# Patient Record
Sex: Male | Born: 1975 | Race: White | Hispanic: No | Marital: Married | Smoking: Current every day smoker
Health system: Southern US, Community
[De-identification: ages and names within clinical notes are randomized; demographics above are authoritative.]

## PROBLEM LIST (undated history)

## (undated) DIAGNOSIS — R0609 Other forms of dyspnea: Secondary | ICD-10-CM

## (undated) DIAGNOSIS — J181 Lobar pneumonia, unspecified organism: Principal | ICD-10-CM

## (undated) DIAGNOSIS — G2581 Restless legs syndrome: Secondary | ICD-10-CM

## (undated) DIAGNOSIS — E785 Hyperlipidemia, unspecified: Secondary | ICD-10-CM

## (undated) DIAGNOSIS — J939 Pneumothorax, unspecified: Secondary | ICD-10-CM

## (undated) DIAGNOSIS — B029 Zoster without complications: Secondary | ICD-10-CM

## (undated) DIAGNOSIS — F17200 Nicotine dependence, unspecified, uncomplicated: Secondary | ICD-10-CM

## (undated) DIAGNOSIS — L409 Psoriasis, unspecified: Secondary | ICD-10-CM

## (undated) DIAGNOSIS — R05 Cough: Principal | ICD-10-CM

## (undated) HISTORY — DX: Cough: R05

## (undated) HISTORY — PX: VASECTOMY: SHX75

## (undated) HISTORY — DX: Hyperlipidemia, unspecified: E78.5

## (undated) HISTORY — DX: Other forms of dyspnea: R06.09

## (undated) HISTORY — PX: CHEST TUBE INSERTION: SHX231

## (undated) HISTORY — DX: Nicotine dependence, unspecified, uncomplicated: F17.200

## (undated) HISTORY — PX: MANDIBLE SURGERY: SHX707

## (undated) HISTORY — DX: Pneumothorax, unspecified: J93.9

## (undated) HISTORY — DX: Zoster without complications: B02.9

## (undated) HISTORY — DX: Restless legs syndrome: G25.81

## (undated) HISTORY — DX: Lobar pneumonia, unspecified organism: J18.1

---

## 1996-04-10 DIAGNOSIS — J939 Pneumothorax, unspecified: Secondary | ICD-10-CM

## 1996-04-10 HISTORY — DX: Pneumothorax, unspecified: J93.9

## 2016-05-09 ENCOUNTER — Emergency Department (INDEPENDENT_AMBULATORY_CARE_PROVIDER_SITE_OTHER)
Admission: EM | Admit: 2016-05-09 | Discharge: 2016-05-09 | Disposition: A | Discharge status: Home / Self Care | Payer: Managed Care, Other (non HMO) | Source: Home / Self Care | Attending: Emergency Medicine | Admitting: Emergency Medicine

## 2016-05-09 ENCOUNTER — Encounter: Payer: Self-pay | Admitting: *Deleted

## 2016-05-09 DIAGNOSIS — M79644 Pain in right finger(s): Secondary | ICD-10-CM | POA: Diagnosis not present

## 2016-05-09 DIAGNOSIS — L03019 Cellulitis of unspecified finger: Secondary | ICD-10-CM

## 2016-05-09 DIAGNOSIS — L03011 Cellulitis of right finger: Secondary | ICD-10-CM | POA: Diagnosis not present

## 2016-05-09 DIAGNOSIS — M79609 Pain in unspecified limb: Secondary | ICD-10-CM

## 2016-05-09 MED ORDER — HYDROCODONE-ACETAMINOPHEN 5-325 MG PO TABS: 1.0000 | ORAL_TABLET | ORAL | 0 refills | Status: DC | PRN

## 2016-05-09 MED ORDER — AMOXICILLIN-POT CLAVULANATE 875-125 MG PO TABS: 1.0000 | ORAL_TABLET | Freq: Two times a day (BID) | ORAL | 0 refills | Status: DC

## 2016-05-09 NOTE — Discharge Instructions (Signed)
Take antibiotic as instructed. Vicodin if needed for severe pain, otherwise may take OTC ibuprofen, 3 by mouth every 8 hours with food for moderate pain. Were sending off a culture to try to identify the germ, this may take up to 3 days.

## 2016-05-09 NOTE — ED Triage Notes (Signed)
Pt c/o RT 4th finger pain and swelling x 2 wks, worse x 1 day. Denies fever. No OTC meds.

## 2016-05-09 NOTE — ED Provider Notes (Signed)
[facility]    CSN: [id number] Arrival date & time: 05/09/16  0845     History   Chief Complaint Chief Complaint  Patient presents with  . Hand Pain    HPI Darryl Vaughn is a 41 y.o. male.   HPI Pt c/o RT 4th finger pain and swelling x 2 wks, worse x 1 day.The throbbing pain is moderate at rest, severe to touch it. No radiation. No bleeding or drainage. He recalls no definite injury, but he admits that he chews fingernails sometimes, and he feels he has finger infection.  Denies fever.  No OTC meds.  History reviewed. No pertinent past medical history. Denies history of chronic disease. Denies diabetes or hypertension Denies any regular prescription medication There are no active problems to display for this patient.   History reviewed. No pertinent surgical history.   denies any history of major surgery   Home Medications    Prior to Admission medications   Medication Sig Start Date End Date Taking? Authorizing Provider  amoxicillin-clavulanate (AUGMENTIN) 875-125 MG tablet Take 1 tablet by mouth every 12 (twelve) hours. Take for 10 days. Take with food. 05/09/16   Lajean Manes, MD  HYDROcodone-acetaminophen (NORCO/VICODIN) 5-325 MG tablet Take 1-2 tablets by mouth every 4 (four) hours as needed for severe pain. Take with food. 05/09/16   Lajean Manes, MD    Family History Family History  Problem Relation Age of Onset  . Heart disease Mother   . Drug abuse Mother   . Heart disease Father     Social History Social History  Substance Use Topics  . Smoking status: Current Every Day Smoker    Packs/day: 1.00    Types: Cigarettes  . Smokeless tobacco: Never Used  . Alcohol use No     Allergies   Patient has no known allergies.   Review of Systems Review of Systems  Constitutional: Negative for fever.  Gastrointestinal: Negative for nausea and vomiting.  All other systems reviewed and are negative.    Physical Exam Triage Vital  Signs ED Triage Vitals  Enc Vitals Group     BP 05/09/16 0905 120/88     Pulse Rate 05/09/16 0905 95     Resp 05/09/16 0905 16     Temp 05/09/16 0905 98 F (36.7 C)     Temp Source 05/09/16 0905 Oral     SpO2 05/09/16 0905 99 %     Weight 05/09/16 0905 142 lb (64.4 kg)     Height 05/09/16 0905 5\' 9"  (1.753 m)     Head Circumference --      Peak Flow --      Pain Score 05/09/16 0907 7     Pain Loc --      Pain Edu? --      Excl. in GC? --    No data found.   Updated Vital Signs BP 120/88 (BP Location: Left Arm)   Pulse 95   Temp 98 F (36.7 C) (Oral)   Resp 16   Ht 5\' 9"  (1.753 m)   Wt 142 lb (64.4 kg)   SpO2 99%   BMI 20.97 kg/m   Visual Acuity Right Eye Distance:   Left Eye Distance:   Bilateral Distance:    Right Eye Near:   Left Eye Near:    Bilateral Near:     Physical Exam  Constitutional: He is oriented to person, place, and time. He appears well-developed and well-nourished. No distress.  HENT:  Head:  Normocephalic and atraumatic.  Eyes: Pupils are equal, round, and reactive to light. No scleral icterus.  Neck: Normal range of motion. Neck supple.  Cardiovascular: Normal rate and regular rhythm.   Pulmonary/Chest: Effort normal.  Abdominal: He exhibits no distension.  Musculoskeletal:       Hands: Neurological: He is alert and oriented to person, place, and time. No cranial nerve deficit.  Skin: Skin is warm and dry.  Psychiatric: He has a normal mood and affect. His behavior is normal.  Vitals reviewed.    UC Treatments / Results  Labs (all labs ordered are listed, but only abnormal results are displayed) Labs Reviewed  WOUND CULTURE   Narrative:    Performed at:  [facility]                [address]                [place], [place] [address]    EKG  EKG Interpretation None       Radiology No results found.  Procedures- Note: Procedure performed 05/09/16, 9:20 AM ..Incision and Drainage Date/Time:  05/11/2016 1:40 PM Performed by: Georgina Pillion, DAVID Authorized by: Georgina Pillion, DAVID   Consent:    Consent obtained:  Verbal   Consent given by:  Patient   Risks discussed:  Bleeding, incomplete drainage, infection and pain   Alternatives discussed:  No treatment, delayed treatment, observation and referral Location:    Type:  Abscess (Paronychia right fourth finger)   Size:  1 cm Anesthesia (see MAR for exact dosages):    Anesthesia method:  Nerve block   Block location:  Digital block   Block needle gauge:  27 G   Block anesthetic:  Lidocaine 2% w/o epi   Block outcome:  Anesthesia achieved Procedure type:    Complexity:  Simple Procedure details:    Incision types:  Stab incision   Incision depth:  Subcutaneous   Scalpel blade:  11   Wound management:  Probed and deloculated   Drainage:  Purulent   Drainage amount:  Copious   Wound treatment:  Wound left open Post-procedure details:    Patient tolerance of procedure:  Tolerated well, no immediate complications Comments:     Wound culture sent. Wound care discussed verbally and also written instructions given. An After Visit Summary was printed and given to the patient.     (including critical care time)  Medications Ordered in UC Medications - No data to display   Initial Impression / Assessment and Plan / UC Course  I have reviewed the triage vital signs and the nursing notes.  Pertinent labs & imaging results that were available during my care of the patient were reviewed by me and considered in my medical decision making (see chart for details).     Final Clinical Impressions(s) / UC Diagnoses   Final diagnoses:  Paronychia of right ring finger  Pain in finger of right hand  Successful incision and drainage performed after 2% Xylocaine digital block. An After Visit Summary was printed and given to the patient. Wound culture sent off.  For pain, small amount of Vicodin prescribed. Otherwise ibuprofen. Pending  wound culture, I prescribed Augmentin for antibacterial coverage. We'll cover most staff, gram positives, and anaerobes, and oral flora (with history of chewing fingernails) (I advised him to avoid chewing fingernails)  New Prescriptions Discharge Medication List as of 05/09/2016  9:50 AM    START taking these medications   Details  amoxicillin-clavulanate (  AUGMENTIN) 875-125 MG tablet Take 1 tablet by mouth every 12 (twelve) hours. Take for 10 days. Take with food., Starting Tue 05/09/2016, Print    HYDROcodone-acetaminophen (NORCO/VICODIN) 5-325 MG tablet Take 1-2 tablets by mouth every 4 (four) hours as needed for severe pain. Take with food., Starting Tue 05/09/2016, Print      Follow-up here in 2-3 days if not improving, or sooner if symptoms become worse. Precautions discussed. Red flags discussed. Questions invited and answered. Patient voiced understanding and agreement.    Lajean Manesavid Massey, MD 05/11/16 1351

## 2016-05-12 ENCOUNTER — Telehealth: Payer: Self-pay | Admitting: Emergency Medicine

## 2016-05-12 LAB — WOUND CULTURE

## 2016-05-22 ENCOUNTER — Telehealth: Payer: Self-pay | Admitting: Emergency Medicine

## 2016-05-22 NOTE — Telephone Encounter (Signed)
Hope his finger is healing or healed by now, we are here for you if you need us. Take care

## 2016-08-19 ENCOUNTER — Encounter: Payer: Self-pay | Admitting: Emergency Medicine

## 2016-08-19 ENCOUNTER — Emergency Department (INDEPENDENT_AMBULATORY_CARE_PROVIDER_SITE_OTHER)
Admission: EM | Admit: 2016-08-19 | Discharge: 2016-08-19 | Disposition: A | Discharge status: Home / Self Care | Payer: Managed Care, Other (non HMO) | Source: Home / Self Care | Attending: Family Medicine | Admitting: Family Medicine

## 2016-08-19 DIAGNOSIS — B029 Zoster without complications: Secondary | ICD-10-CM | POA: Diagnosis not present

## 2016-08-19 DIAGNOSIS — L409 Psoriasis, unspecified: Secondary | ICD-10-CM

## 2016-08-19 HISTORY — DX: Psoriasis, unspecified: L40.9

## 2016-08-19 MED ORDER — MUPIROCIN CALCIUM 2 % EX CREA: 1.0000 "application " | TOPICAL_CREAM | Freq: Three times a day (TID) | CUTANEOUS | 0 refills | Status: DC

## 2016-08-19 MED ORDER — VALACYCLOVIR HCL 1 G PO TABS: 1000.0000 mg | ORAL_TABLET | Freq: Three times a day (TID) | ORAL | 0 refills | Status: DC

## 2016-08-19 NOTE — ED Provider Notes (Signed)
[facility]    CSN: [id number] Arrival date & time: 08/19/16  0907     History   Chief Complaint Chief Complaint  Patient presents with  . Mass    HPI Darryl Vaughn is a 41 y.o. male.   Patient was working outside one week ago, and the next day he felt some discomfort on his right chest.  He was concerned that he may have had an insect bite, although he does not recall any stinging sensation.  Five days ago he noticed a distinct rash on his right chest with surrounding pain and sensation of burning.  The lesions have not improved by applications of Neosporing, but have "scabbed over" somewhat.  He feels well otherwise.  No fevers, chills, and sweats. He has chronic psoriasis that he manages with OTC topical creams.    The history is provided by the patient.  Rash  Location:  Torso Torso rash location:  R chest Quality: burning, dryness, painful, redness and swelling   Quality: not blistering, not draining and not weeping   Pain details:    Quality:  Aching and burning   Severity:  Mild   Onset quality:  Gradual   Duration:  1 week   Timing:  Constant   Progression:  Worsening Severity:  Mild Onset quality:  Gradual Timing:  Constant Progression:  Worsening Chronicity:  New Context: not animal contact, not chemical exposure, not exposure to similar rash, not food, not hot tub use, not insect bite/sting, not medications, not new detergent/soap and not plant contact   Relieved by:  Nothing Worsened by:  Nothing Ineffective treatments:  Antibiotic cream Associated symptoms: no abdominal pain, no diarrhea, no fatigue, no fever, no headaches, no induration, no joint pain, no myalgias, no nausea and no sore throat     Past Medical History:  Diagnosis Date  . Psoriasis     There are no active problems to display for this patient.   Past Surgical History:  Procedure Laterality Date  . LUNG SURGERY     collapsed lung  . MANDIBLE SURGERY          Home Medications    Prior to Admission medications   Medication Sig Start Date End Date Taking? Authorizing Provider  Johnson [employer] JohnsonCoal Tar Extract (PSORIASIN EX) Apply topically.   Yes [provider]  mupirocin cream (BACTROBAN) 2 % Apply 1 application topically 3 (three) times daily. (Rx void after 08/27/16) 08/19/16   Lattie HawBeese, Asah Lamay A, MD  valACYclovir (VALTREX) 1000 MG tablet Take 1 tablet (1,000 mg total) by mouth 3 (three) times daily. 08/19/16   Lattie HawBeese, Ryden Wainer A, MD    Family History Family History  Problem Relation Age of Onset  . Heart disease Mother   . Drug abuse Mother   . Heart disease Father     Social History Social History  Substance Use Topics  . Smoking status: Current Every Day Smoker    Packs/day: 1.00    Types: Cigarettes  . Smokeless tobacco: Never Used  . Alcohol use No     Allergies   Patient has no known allergies.   Review of Systems Review of Systems  Constitutional: Negative for fatigue and fever.  HENT: Negative for sore throat.   Gastrointestinal: Negative for abdominal pain, diarrhea and nausea.  Musculoskeletal: Negative for arthralgias and myalgias.  Skin: Positive for rash.  Neurological: Negative for headaches.  All other systems reviewed and are negative.    Physical Exam Triage Vital Signs ED Triage Vitals  Enc Vitals Group     BP 08/19/16 0934 111/76     Pulse Rate 08/19/16 0934 87     Resp 08/19/16 0934 16     Temp 08/19/16 0934 98.2 F (36.8 C)     Temp Source 08/19/16 0934 Oral     SpO2 08/19/16 0934 96 %     Weight 08/19/16 0936 139 lb (63 kg)     Height 08/19/16 0936 5\' 9"  (1.753 m)     Head Circumference --      Peak Flow --      Pain Score 08/19/16 0936 5     Pain Loc --      Pain Edu? --      Excl. in GC? --    No data found.   Updated Vital Signs BP 111/76 (BP Location: Left Arm)   Pulse 87   Temp 98.2 F (36.8 C) (Oral)   Resp 16   Ht 5\' 9"  (1.753 m)   Wt 139 lb (63 kg)   SpO2 96%   BMI  20.53 kg/m   Visual Acuity Right Eye Distance:   Left Eye Distance:   Bilateral Distance:    Right Eye Near:   Left Eye Near:    Bilateral Near:     Physical Exam  Constitutional: He appears well-developed and well-nourished. No distress.  HENT:  Head: Normocephalic.  Nose: Nose normal.  Mouth/Throat: Oropharynx is clear and moist.  Eyes: Conjunctivae are normal. Pupils are equal, round, and reactive to light.  Cardiovascular: Normal heart sounds.   Pulmonary/Chest: Breath sounds normal.    Beneath the right nipple is a crop of typical herpetiform lesions as noted on diagram.  No evidence cellulitis.  In addition to the herpetic eruption on right chest, there are generalized typical psoriatic lesions on torso and extremities.  Lymphadenopathy:    He has no cervical adenopathy.  Neurological: He is alert.  Skin: Skin is warm and dry.  Nursing note and vitals reviewed.    UC Treatments / Results  Labs (all labs ordered are listed, but only abnormal results are displayed) Labs Reviewed - No data to display  EKG  EKG Interpretation None       Radiology No results found.  Procedures Procedures (including critical care time)  Medications Ordered in UC Medications - No data to display   Initial Impression / Assessment and Plan / UC Course  I have reviewed the triage vital signs and the nursing notes.  Pertinent labs & imaging results that were available during my care of the patient were reviewed by me and considered in my medical decision making (see chart for details).    Begin Valtrex.  Stop applying Neosporin (does not appear to have cellulitis) However, may begin applying mupirocin cream (Bactroban) if rash begins to look infected (given Rx to hold). Followup with Family Doctor if not improved in 7 to 10 days.      Final Clinical Impressions(s) / UC Diagnoses   Final diagnoses:  Herpes zoster without complication    New Prescriptions New  Prescriptions   MUPIROCIN CREAM (BACTROBAN) 2 %    Apply 1 application topically 3 (three) times daily. (Rx void after 08/27/16)   VALACYCLOVIR (VALTREX) 1000 MG TABLET    Take 1 tablet (1,000 mg total) by mouth 3 (three) times daily.     Lattie Haw, MD 08/19/16 1010

## 2016-08-19 NOTE — Discharge Instructions (Signed)
Begin applying mupirocin cream (Bactroban) if rash begins to look infected.

## 2016-08-19 NOTE — ED Triage Notes (Signed)
Patient has chronic psoriasis and is currently experiencing rash scattered all over body, with heavy area on trunk. For past 5 days has noticed one area on right side of chest below breast area that is edematous and aching under rash.

## 2016-08-21 ENCOUNTER — Telehealth: Payer: Self-pay

## 2016-08-21 NOTE — Telephone Encounter (Signed)
Pt feeling better, feels that the medication is working.  Will follow up with PCP or UC as needed.

## 2016-08-22 ENCOUNTER — Ambulatory Visit (INDEPENDENT_AMBULATORY_CARE_PROVIDER_SITE_OTHER): Payer: Managed Care, Other (non HMO) | Admitting: Physician Assistant

## 2016-08-22 ENCOUNTER — Encounter: Payer: Self-pay | Admitting: Physician Assistant

## 2016-08-22 VITALS — BP 138/81 | HR 89 | Resp 14 | Ht 68.0 in | Wt 139.0 lb

## 2016-08-22 DIAGNOSIS — E785 Hyperlipidemia, unspecified: Secondary | ICD-10-CM

## 2016-08-22 DIAGNOSIS — F17200 Nicotine dependence, unspecified, uncomplicated: Secondary | ICD-10-CM | POA: Diagnosis not present

## 2016-08-22 DIAGNOSIS — Z7689 Persons encountering health services in other specified circumstances: Secondary | ICD-10-CM | POA: Diagnosis not present

## 2016-08-22 DIAGNOSIS — Z8249 Family history of ischemic heart disease and other diseases of the circulatory system: Secondary | ICD-10-CM | POA: Diagnosis not present

## 2016-08-22 DIAGNOSIS — L409 Psoriasis, unspecified: Secondary | ICD-10-CM | POA: Diagnosis not present

## 2016-08-22 DIAGNOSIS — R0602 Shortness of breath: Secondary | ICD-10-CM | POA: Diagnosis not present

## 2016-08-22 DIAGNOSIS — R030 Elevated blood-pressure reading, without diagnosis of hypertension: Secondary | ICD-10-CM

## 2016-08-22 HISTORY — DX: Nicotine dependence, unspecified, uncomplicated: F17.200

## 2016-08-22 LAB — CBC
HEMATOCRIT: 41.9 % (ref 38.5–50.0)
HEMOGLOBIN: 14.3 g/dL (ref 13.2–17.1)
MCH: 30.5 pg (ref 27.0–33.0)
MCHC: 34.1 g/dL (ref 32.0–36.0)
MCV: 89.3 fL (ref 80.0–100.0)
MPV: 8.5 fL (ref 7.5–12.5)
PLATELETS: 255 10*3/uL (ref 140–400)
RBC: 4.69 MIL/uL (ref 4.20–5.80)
RDW: 13.9 % (ref 11.0–15.0)
WBC: 10.4 10*3/uL (ref 3.8–10.8)

## 2016-08-22 NOTE — Progress Notes (Signed)
HPI:                                                                Darryl Vaughn is a 41 y.o. male who presents to [facility]: Primary Care Sports Medicine today to establish care   Current Concerns include psoriasis, shortness of breath  Patient reports history of chronic psoriasis involving his scalp, abdomen and extensor surfaces of his elbows and knees. He has tried multiple prescription topicals in the past and states they did not control his symptoms and they stained his clothes.   Shortness of breath: patient reports SOB at rest and with exertion over the last 2-3 weeks. He also endorses an intermittent cough. Patient is a current everyday smoker with 22 pack years. He is also currently being treated for Shingles on his right torso, which has made it difficult to take a deep breath. He denies fever, chills, malaise, hemoptysis. He denies history of COPD or asthma.  Health Maintenance Health Maintenance  Topic Date Due  . HIV Screening  03/08/1990  . INFLUENZA VACCINE  11/08/2016  . TETANUS/TDAP  04/10/2022    Past Medical History:  Diagnosis Date  . Pneumothorax 1998  . Psoriasis   . Shingles    Past Surgical History:  Procedure Laterality Date  . CHEST TUBE INSERTION    . MANDIBLE SURGERY    . VASECTOMY    . VASECTOMY     Social History  Substance Use Topics  . Smoking status: Current Every Day Smoker    Packs/day: 1.00    Years: 22.00    Types: Cigarettes  . Smokeless tobacco: Never Used  . Alcohol use No   family history includes Drug abuse in his mother; Heart attack in his father; Heart disease in his father and mother; Hypertension in his father.  ROS: negative except as noted in the HPI  Medications: Current Outpatient Prescriptions  Medication Sig Dispense Refill  . valACYclovir (VALTREX) 1000 MG tablet Take 1 tablet (1,000 mg total) by mouth 3 (three) times daily. 21 tablet 0   No current facility-administered medications for  this visit.    No Known Allergies     Objective:  BP 138/81   Pulse 89   Ht 5\' 8"  (1.727 m)   Wt 139 lb (63 kg)   BMI 21.13 kg/m  Gen: well-groomed, cooperative, not ill-appearing, no distress Pulm: Normal work of breathing, normal phonation, clear to auscultation bilaterally CV: Normal rate, regular rhythm, s1 and s2 distinct, no murmurs, clicks or rubs, no carotid bruit Neuro: alert and oriented x 3, EOM's intact MSK: moving all extremities, normal gait and station, no peripheral edema Psych: good eye contact, normal affect, euthymic mood, normal speech and thought content Skin: multiple, scaly plaques on extensor surfaces of bilateral elbows and knees; crusting, herpetiform lesions on right chest wall just inferior to the right nipple  No results found.  Depression screen PHQ 2/9 08/22/2016  Decreased Interest 0  Down, Depressed, Hopeless 0  PHQ - 2 Score 0      Assessment and Plan: 41 y.o. male with   1. Encounter to establish care - CBC - Comprehensive metabolic panel - Lipid Panel w/reflex Direct LDL - negative PHQ2  2. Tobacco use disorder, Shortness of breath -  CXR 2 View - recommend follow-up Spirometry in 4 weeks / when Shingles rash has resolved - recommended smoking cessation / cutting back on cigarettes  3. Family history of heart attack - patient has cardiac risk factors including tobacco use - checking lipid panel/LDL - counseled on heart healthy lifestyle  4. Psoriasis - patient is a good candidate for biologic therapy - Ambulatory referral to Dermatology  5. Elevated blood pressure reading BP Readings from Last 3 Encounters:  08/22/16 138/81  08/19/16 111/76  05/09/16 120/88  - patient instructed on how to monitor BP's at home and provided with log - limit salt. DASH eating plan - increase CV activity - follow-up in 4 weeks   Patient education and anticipatory guidance given Patient agrees with treatment plan Follow-up 4 weeks for  Spirometry/Blood pressure or sooner as needed  Levonne Hubertharley E. Elli Groesbeck PA-C

## 2016-08-22 NOTE — Patient Instructions (Addendum)
- I have placed a referral for you to see dermatology to discuss starting systemic/oral medication for your psoriasis. There is a medication called Enbrel that you may be a good candidate for. This is a biologic medication that requires some testing before it can be safely started  - Return in 4 weeks for spirometry (lung function testing) to evaluate your shortness of breath. Consider cutting back on cigarettes / smoking cessation  - Go downstairs for labs  For your blood pressure: - Check blood pressure at home for the next 2 weeks - Check around the same time each day in a relaxed setting (rest for at least 3 minutes, legs uncrossed, arm resting on a table at level or your heart) - Limit salt. Follow DASH eating plan - Follow-up in 2-3 weeks   Physical Activity Recommendations for modifying lipids and lowering blood pressure Engage in aerobic physical activity to reduce LDL-cholesterol, non-HDL-cholesterol, and blood pressure  Frequency: 3-4 sessions per week  Intensity: moderate to vigorous  Duration: 40 minutes on average  Physical Activity Recommendations for secondary prevention 1. Aerobic exercise  Frequency: 3-5 sessions per week  Intensity: 50-80% capacity  Duration: 20 - 60 minutes  Examples: walking, treadmill, cycling, rowing, stair climbing, and arm/leg ergometry  2. Resistance exercise  Frequency: 2-3 sessions per week  Intensity: 10-15 repetitions/set to moderate fatigue  Duration: 1-3 sets of 8-10 upper and lower body exercises  Examples: calisthenics, elastic bands, cuff/hand weights, dumbbels, free weights, wall pulleys, and weight machines  Heart-Healthy Lifestyle  Eating a diet rich in vegetables, fruits and whole grains: also includes low-fat dairy products, poultry, fish, legumes, and nuts; limit intake of sweets, sugar-sweetened beverages and red meats  Getting regular exercise  Maintaining a healthy weight  Not smoking or getting help  quitting  Staying on top of your health; for some people, lifestyle changes alone may not be enough to prevent a heart attack or stroke. In these cases, taking a statin at the right dose will most likely be necessary   DASH Eating Plan DASH stands for "Dietary Approaches to Stop Hypertension." The DASH eating plan is a healthy eating plan that has been shown to reduce high blood pressure (hypertension). It may also reduce your risk for type 2 diabetes, heart disease, and stroke. The DASH eating plan may also help with weight loss. What are tips for following this plan? General guidelines   Avoid eating more than 2,300 mg (milligrams) of salt (sodium) a day. If you have hypertension, you may need to reduce your sodium intake to 1,500 mg a day.  Limit alcohol intake to no more than 1 drink a day for nonpregnant women and 2 drinks a day for men. One drink equals 12 oz of beer, 5 oz of wine, or 1 oz of hard liquor.  Work with your health care provider to maintain a healthy body weight or to lose weight. Ask what an ideal weight is for you.  Get at least 30 minutes of exercise that causes your heart to beat faster (aerobic exercise) most days of the week. Activities may include walking, swimming, or biking.  Work with your health care provider or diet and nutrition specialist (dietitian) to adjust your eating plan to your individual calorie needs. Reading food labels   Check food labels for the amount of sodium per serving. Choose foods with less than 5 percent of the Daily Value of sodium. Generally, foods with less than 300 mg of sodium per serving fit into  this eating plan.  To find whole grains, look for the word "whole" as the first word in the ingredient list. Shopping   Buy products labeled as "low-sodium" or "no salt added."  Buy fresh foods. Avoid canned foods and premade or frozen meals. Cooking   Avoid adding salt when cooking. Use salt-free seasonings or herbs instead of table  salt or sea salt. Check with your health care provider or pharmacist before using salt substitutes.  Do not fry foods. Cook foods using healthy methods such as baking, boiling, grilling, and broiling instead.  Cook with heart-healthy oils, such as olive, canola, soybean, or sunflower oil. Meal planning    Eat a balanced diet that includes:  5 or more servings of fruits and vegetables each day. At each meal, try to fill half of your plate with fruits and vegetables.  Up to 6-8 servings of whole grains each day.  Less than 6 oz of lean meat, poultry, or fish each day. A 3-oz serving of meat is about the same size as a deck of cards. One egg equals 1 oz.  2 servings of low-fat dairy each day.  A serving of nuts, seeds, or beans 5 times each week.  Heart-healthy fats. Healthy fats called Omega-3 fatty acids are found in foods such as flaxseeds and coldwater fish, like sardines, salmon, and mackerel.  Limit how much you eat of the following:  Canned or prepackaged foods.  Food that is high in trans fat, such as fried foods.  Food that is high in saturated fat, such as fatty meat.  Sweets, desserts, sugary drinks, and other foods with added sugar.  Full-fat dairy products.  Do not salt foods before eating.  Try to eat at least 2 vegetarian meals each week.  Eat more home-cooked food and less restaurant, buffet, and fast food.  When eating at a restaurant, ask that your food be prepared with less salt or no salt, if possible. What foods are recommended? The items listed may not be a complete list. Talk with your dietitian about what dietary choices are best for you. Grains  Whole-grain or whole-wheat bread. Whole-grain or whole-wheat pasta. Brown rice. Orpah Cobbatmeal. Quinoa. Bulgur. Whole-grain and low-sodium cereals. Pita bread. Low-fat, low-sodium crackers. Whole-wheat flour tortillas. Vegetables  Fresh or frozen vegetables (raw, steamed, roasted, or grilled). Low-sodium or  reduced-sodium tomato and vegetable juice. Low-sodium or reduced-sodium tomato sauce and tomato paste. Low-sodium or reduced-sodium canned vegetables. Fruits  All fresh, dried, or frozen fruit. Canned fruit in natural juice (without added sugar). Meat and other protein foods  Skinless chicken or [place]urkey. Ground chicken or [place]urkey. Pork with fat trimmed off. Fish and seafood. Egg whites. Dried beans, peas, or lentils. Unsalted nuts, nut butters, and seeds. Unsalted canned beans. Lean cuts of beef with fat trimmed off. Low-sodium, lean deli meat. Dairy  Low-fat (1%) or fat-free (skim) milk. Fat-free, low-fat, or reduced-fat cheeses. Nonfat, low-sodium ricotta or cottage cheese. Low-fat or nonfat yogurt. Low-fat, low-sodium cheese. Fats and oils  Soft margarine without trans fats. Vegetable oil. Low-fat, reduced-fat, or light mayonnaise and salad dressings (reduced-sodium). Canola, safflower, olive, soybean, and sunflower oils. Avocado. Seasoning and other foods  Herbs. Spices. Seasoning mixes without salt. Unsalted popcorn and pretzels. Fat-free sweets. What foods are not recommended? The items listed may not be a complete list. Talk with your dietitian about what dietary choices are best for you. Grains  Baked goods made with fat, such as croissants, muffins, or some breads. Dry pasta or rice  meal packs. Vegetables  Creamed or fried vegetables. Vegetables in a cheese sauce. Regular canned vegetables (not low-sodium or reduced-sodium). Regular canned tomato sauce and paste (not low-sodium or reduced-sodium). Regular tomato and vegetable juice (not low-sodium or reduced-sodium). Rosita Fire. Olives. Fruits  Canned fruit in a light or heavy syrup. Fried fruit. Fruit in cream or butter sauce. Meat and other protein foods  Fatty cuts of meat. Ribs. Fried meat. Tomasa Blase. Sausage. Bologna and other processed lunch meats. Salami. Fatback. Hotdogs. Bratwurst. Salted nuts and seeds. Canned beans with added salt.  Canned or smoked fish. Whole eggs or egg yolks. Chicken or [place] with skin. Dairy  Whole or 2% milk, cream, and half-and-half. Whole or full-fat cream cheese. Whole-fat or sweetened yogurt. Full-fat cheese. Nondairy creamers. Whipped toppings. Processed cheese and cheese spreads. Fats and oils  Butter. Stick margarine. Lard. Shortening. Ghee. Bacon fat. Tropical oils, such as coconut, palm kernel, or palm oil. Seasoning and other foods  Salted popcorn and pretzels. Onion salt, garlic salt, seasoned salt, table salt, and sea salt. Worcestershire sauce. Tartar sauce. Barbecue sauce. Teriyaki sauce. Soy sauce, including reduced-sodium. Steak sauce. Canned and packaged gravies. Fish sauce. Oyster sauce. Cocktail sauce. Horseradish that you find on the shelf. Ketchup. Mustard. Meat flavorings and tenderizers. Bouillon cubes. Hot sauce and Tabasco sauce. Premade or packaged marinades. Premade or packaged taco seasonings. Relishes. Regular salad dressings. Where to find more information:  National Heart, Lung, and Blood Institute: PopSteam.is  American Heart Association: www.heart.org Summary  The DASH eating plan is a healthy eating plan that has been shown to reduce high blood pressure (hypertension). It may also reduce your risk for type 2 diabetes, heart disease, and stroke.  With the DASH eating plan, you should limit salt (sodium) intake to 2,300 mg a day. If you have hypertension, you may need to reduce your sodium intake to 1,500 mg a day.  When on the DASH eating plan, aim to eat more fresh fruits and vegetables, whole grains, lean proteins, low-fat dairy, and heart-healthy fats.  Work with your health care provider or diet and nutrition specialist (dietitian) to adjust your eating plan to your individual calorie needs. This information is not intended to replace advice given to you by your health care provider. Make sure you discuss any questions you have with your health care  provider. Document Released: 03/16/2011 Document Revised: 03/20/2016 Document Reviewed: 03/20/2016 Elsevier Interactive Patient Education  2017 [employer].

## 2016-08-23 LAB — COMPREHENSIVE METABOLIC PANEL
ALBUMIN: 4.4 g/dL (ref 3.6–5.1)
ALK PHOS: 66 U/L (ref 40–115)
ALT: 16 U/L (ref 9–46)
AST: 14 U/L (ref 10–40)
BUN: 11 mg/dL (ref 7–25)
CALCIUM: 9.5 mg/dL (ref 8.6–10.3)
CO2: 23 mmol/L (ref 20–31)
Chloride: 101 mmol/L (ref 98–110)
Creat: 0.82 mg/dL (ref 0.60–1.35)
Glucose, Bld: 118 mg/dL — ABNORMAL HIGH (ref 65–99)
POTASSIUM: 3.9 mmol/L (ref 3.5–5.3)
Sodium: 137 mmol/L (ref 135–146)
TOTAL PROTEIN: 7.4 g/dL (ref 6.1–8.1)
Total Bilirubin: 0.3 mg/dL (ref 0.2–1.2)

## 2016-08-23 LAB — LIPID PANEL W/REFLEX DIRECT LDL
CHOL/HDL RATIO: 7 ratio — AB (ref <5.0)
CHOLESTEROL: 197 mg/dL (ref <200)
HDL: 28 mg/dL — ABNORMAL LOW (ref >40)
LDL-Cholesterol: 136 mg/dL — ABNORMAL HIGH
Non-HDL Cholesterol (Calc): 169 mg/dL — ABNORMAL HIGH (ref <130)
TRIGLYCERIDES: 194 mg/dL — AB (ref <150)

## 2016-08-24 ENCOUNTER — Encounter: Payer: Self-pay | Admitting: Physician Assistant

## 2016-08-24 DIAGNOSIS — R0609 Other forms of dyspnea: Secondary | ICD-10-CM

## 2016-08-24 DIAGNOSIS — R0600 Dyspnea, unspecified: Secondary | ICD-10-CM

## 2016-08-24 DIAGNOSIS — E785 Hyperlipidemia, unspecified: Secondary | ICD-10-CM | POA: Insufficient documentation

## 2016-08-24 HISTORY — DX: Hyperlipidemia, unspecified: E78.5

## 2016-08-24 HISTORY — DX: Dyspnea, unspecified: R06.00

## 2016-08-24 HISTORY — DX: Other forms of dyspnea: R06.09

## 2016-08-25 MED ORDER — ATORVASTATIN CALCIUM 10 MG PO TABS: 10.0000 mg | ORAL_TABLET | Freq: Every day | ORAL | 3 refills | Status: DC

## 2016-08-25 NOTE — Addendum Note (Signed)
Addended by: Gena FrayUMMINGS, CHARLEY E on: 08/25/2016 11:03 AM   Modules accepted: Orders

## 2016-09-08 ENCOUNTER — Ambulatory Visit (INDEPENDENT_AMBULATORY_CARE_PROVIDER_SITE_OTHER): Payer: Managed Care, Other (non HMO)

## 2016-09-08 DIAGNOSIS — R0602 Shortness of breath: Secondary | ICD-10-CM

## 2016-09-19 ENCOUNTER — Ambulatory Visit (INDEPENDENT_AMBULATORY_CARE_PROVIDER_SITE_OTHER): Payer: Managed Care, Other (non HMO) | Admitting: Physician Assistant

## 2016-09-19 ENCOUNTER — Encounter: Payer: Self-pay | Admitting: Physician Assistant

## 2016-09-19 VITALS — BP 127/75 | HR 77 | Ht 69.0 in | Wt 135.0 lb

## 2016-09-19 DIAGNOSIS — F17200 Nicotine dependence, unspecified, uncomplicated: Secondary | ICD-10-CM | POA: Diagnosis not present

## 2016-09-19 DIAGNOSIS — R0602 Shortness of breath: Secondary | ICD-10-CM

## 2016-09-19 DIAGNOSIS — G2581 Restless legs syndrome: Secondary | ICD-10-CM

## 2016-09-19 HISTORY — DX: Restless legs syndrome: G25.81

## 2016-09-19 MED ORDER — ALBUTEROL SULFATE (2.5 MG/3ML) 0.083% IN NEBU
2.5000 mg | INHALATION_SOLUTION | Freq: Once | RESPIRATORY_TRACT | Status: AC
  Administered 2016-09-19: 2.5 mg via RESPIRATORY_TRACT

## 2016-09-19 MED ORDER — ASPIRIN EC 81 MG PO TBEC: 81.0000 mg | DELAYED_RELEASE_TABLET | Freq: Every day | ORAL | 11 refills | Status: DC

## 2016-09-19 NOTE — Progress Notes (Signed)
HPI:                                                                Darryl Vaughn is a 41 y.o. male [facility] today for Spirometry   Patient with PMH of tobacco use, approximately 1 ppd with 22 pack years, reports intermittent SOB at rest and with exertion over the last month. Patient states it can happen after running or with very minimal exertion. He also endorses an intermittent cough, which he describes as mostly "sinus stuff first thing in the morning" that resolves during the day.  He denies fever, chills, unintentional weight loss, malaise, hemoptysis. He denies history of COPD or asthma.  Patient has been monitoring BP's at home. BP range 102-125/66-79. Denies vision change, headache, chest pain with exertion, orthopnea, lightheadedness, syncope and edema. Risk factors include:  Patient is also concerned that he may have restless leg syndrome. He describes an "urge to move" deep in his calf muscles most nights of the week. It is relieved by getting up and walking aorund or tightening his lower leg muscles. He denies numbness, tingling, paresthesias, lower extremity pain or claudication.  No history of IDA. Last Hgb 14.3  Past Medical History:  Diagnosis Date  . Pneumothorax 1998  . Psoriasis   . Shingles    Past Surgical History:  Procedure Laterality Date  . CHEST TUBE INSERTION    . MANDIBLE SURGERY    . VASECTOMY    . VASECTOMY     Social History  Substance Use Topics  . Smoking status: Current Every Day Smoker    Packs/day: 1.00    Years: 22.00    Types: Cigarettes  . Smokeless tobacco: Never Used  . Alcohol use No   family history includes Drug abuse in his mother; Heart attack in his father; Heart disease in his father and mother; Hypertension in his father.  ROS: negative except as noted in the HPI  Medications: Current Outpatient Prescriptions  Medication Sig Dispense Refill  . Apremilast  (OTEZLA PO) Take by mouth.    Marland Kitchen. atorvastatin (LIPITOR) 10 MG tablet Take 1 tablet (10 mg total) by mouth daily. 90 tablet 3   No current facility-administered medications for this visit.    No Known Allergies     Objective:  BP 127/75   Pulse 77   Ht 5\' 9"  (1.753 m)   Wt 135 lb (61.2 kg)   SpO2 99%   BMI 19.94 kg/m  Gen: well-groomed, cooperative, not ill-appearing, no distress Pulm: Normal work of breathing, normal phonation, clear to auscultation bilaterally, no wheezes, rales or rhonchi CV: Normal rate, regular rhythm, s1 and s2 distinct, no murmurs, clicks or rubs  Neuro: alert and oriented x 3, EOM's intact, no tremor MSK: moving all extremities, normal gait and station, no peripheral edema, calves nontender Lymph: no cervical or tonsillar adenopathy Skin: warm, dry, intact; multiple psoriatic plaques on extremities bilaterally   No results found for this or any previous visit (from the past 72 hour(s)). No results found.  Spirometry 09/19/2016 Pre-bronchodilator FVC 4.76 FEV1 3.45, 85% predicted FEV1/FVC 72.5% Post-bronchodilator FVC 4.51 FEV1 3.53 FEV1/FVC 78.3%  Assessment and Plan: 41 y.o. male with   1. SOB (shortness of  breath) - Spirometry: Pre & Post Eval: FEV1 85% predicted, FEV1/FVC >0.7, patient is at risk for COPD given tobacco use - reviewed CXR, which was negative for acute cardiopulmonary disease - Plan to repeat Spirometry/Lung function testing in 1 year or sooner if symptoms worsen (daily cough, more productive cough, shortness of breath with rest)  2. Tobacco use disorder - assessed readiness to quit. Patient is not ready, but did set a date for the fall of 2018 - provided with smoking cessation resources, including [place] Quit Line - instructed patient to return in the fall   3. Restless leg syndrome - patient is going to try lifestyle interventions including heating pads on his legs, avoiding anticholinergic medication, regular exercise, and  massage - patient is going to research medication options, including Requip and Horizant   Patient education and anticipatory guidance given Patient agrees with treatment plan Follow-up in 4 weeks for RLS or sooner as needed if symptoms worsen or fail to improve  Levonne Hubert PA-C

## 2016-09-19 NOTE — Patient Instructions (Addendum)
-   Plan to repeat Spirometry/Lung function testing in 1 year or sooner if symptoms worsen (daily cough, more productive cough, shortness of breath with rest) - Plan to return for smoking cessation counseling when you are ready - Call the [place] Quit Line for free coaching 1-800-QUIT NOW  For Restless Leg, research treatment options provided (Horizant or Requip) and follow-up in 2-4 weeks

## 2016-11-22 ENCOUNTER — Ambulatory Visit (INDEPENDENT_AMBULATORY_CARE_PROVIDER_SITE_OTHER): Payer: Managed Care, Other (non HMO) | Admitting: Physician Assistant

## 2016-11-22 ENCOUNTER — Encounter: Payer: Self-pay | Admitting: Physician Assistant

## 2016-11-22 ENCOUNTER — Ambulatory Visit (INDEPENDENT_AMBULATORY_CARE_PROVIDER_SITE_OTHER): Payer: Managed Care, Other (non HMO)

## 2016-11-22 VITALS — BP 127/87 | HR 74 | Temp 98.0°F | Ht 69.0 in | Wt 137.0 lb

## 2016-11-22 DIAGNOSIS — Z720 Tobacco use: Secondary | ICD-10-CM | POA: Diagnosis not present

## 2016-11-22 DIAGNOSIS — F17200 Nicotine dependence, unspecified, uncomplicated: Secondary | ICD-10-CM

## 2016-11-22 DIAGNOSIS — J3089 Other allergic rhinitis: Secondary | ICD-10-CM | POA: Diagnosis not present

## 2016-11-22 DIAGNOSIS — Z716 Tobacco abuse counseling: Secondary | ICD-10-CM

## 2016-11-22 DIAGNOSIS — R05 Cough: Secondary | ICD-10-CM | POA: Insufficient documentation

## 2016-11-22 DIAGNOSIS — G2581 Restless legs syndrome: Secondary | ICD-10-CM

## 2016-11-22 DIAGNOSIS — R0609 Other forms of dyspnea: Secondary | ICD-10-CM | POA: Diagnosis not present

## 2016-11-22 DIAGNOSIS — R0989 Other specified symptoms and signs involving the circulatory and respiratory systems: Secondary | ICD-10-CM

## 2016-11-22 DIAGNOSIS — R053 Chronic cough: Secondary | ICD-10-CM

## 2016-11-22 HISTORY — DX: Chronic cough: R05.3

## 2016-11-22 MED ORDER — VARENICLINE TARTRATE 1 MG PO TABS: 1.0000 mg | ORAL_TABLET | Freq: Two times a day (BID) | ORAL | 2 refills | Status: DC

## 2016-11-22 MED ORDER — AZITHROMYCIN 250 MG PO TABS: ORAL_TABLET | ORAL | 0 refills | Status: DC

## 2016-11-22 MED ORDER — ALBUTEROL SULFATE HFA 108 (90 BASE) MCG/ACT IN AERS: 1.0000 | INHALATION_SPRAY | RESPIRATORY_TRACT | 0 refills | Status: DC | PRN

## 2016-11-22 MED ORDER — VARENICLINE TARTRATE 0.5 MG X 11 & 1 MG X 42 PO MISC: ORAL | 0 refills | Status: DC

## 2016-11-22 MED ORDER — ROPINIROLE HCL 0.25 MG PO TABS: ORAL_TABLET | ORAL | 1 refills | Status: DC

## 2016-11-22 MED ORDER — PREDNISONE 50 MG PO TABS: ORAL_TABLET | ORAL | 0 refills | Status: DC

## 2016-11-22 MED ORDER — TRIAMCINOLONE ACETONIDE 55 MCG/ACT NA AERO
2.0000 | INHALATION_SPRAY | Freq: Every day | NASAL | 12 refills | Status: DC
Start: 2016-11-22 — End: 2018-06-11

## 2016-11-22 NOTE — Patient Instructions (Addendum)
For cough: - chest x-ray today - antibiotic and steroid for 5 days - albuterol inhaler 1-2 puffs as needed for shortness of breath or 2 puffs 30 minutes prior to exercise - flonase 1 spray each nostril nightly - continue antihistamine - smoking cessation  Chantix: - sign up for the Get Quit plan online https://www.get-quit.com/content/getting-started-chantix-content - Quit Date 9/15 - you can continue smoking up until your quit date  For RLS: - start Requip 1 hour before bedtime - start with 1 tab for the first three days - may increase your dose as follows: 0.5 mg/day on days 3 to 7; week 2, 1 mg/day; week 3, 1.5 mg/day; week 4

## 2016-11-22 NOTE — Progress Notes (Signed)
HPI:                                                                Darryl CarbonDaniel Vaughn is a 41 y.o. male [facility] today for cough  Patient with PMH of tobacco use, approximately 1 ppd with 22 pack years, chronic cough presents with worsening productive cough x 2-3 weeks. Productive of clear sputum. Reports "bad coughing fits" worse while lying down. Reports dyspnea on exertion. Reports he has cut back on cigarettes to a little bit less than 1 pack per day. He did have a normal spirometry on 09/19/2016.  Tobacco use: 22 packyear smoking history. Reports he has tried cold Malaw[place]urkey method and nicotine patches in the past. Expresses readiness to quit. Interested in Chantix.  Patient also reports worsening restless leg syndrome impacting his sleep.  He describes an "urge to move" deep in his calf muscles most nights of the week. It is relieved by getting up and walking aorund or tightening his lower leg muscles. He denies numbness, tingling, paresthesias, lower extremity pain or claudication.  No history of IDA. Last Hgb 14.3 Denies taking benadryl or sleep aids.  Past Medical History:  Diagnosis Date  . Pneumothorax 1998  . Psoriasis   . Shingles    Past Surgical History:  Procedure Laterality Date  . CHEST TUBE INSERTION    . MANDIBLE SURGERY    . VASECTOMY    . VASECTOMY     Social History  Substance Use Topics  . Smoking status: Current Every Day Smoker    Packs/day: 1.00    Years: 22.00    Types: Cigarettes  . Smokeless tobacco: Never Used  . Alcohol use No   family history includes Drug abuse in his mother; Heart attack in his father; Heart disease in his father and mother; Hypertension in his father.  ROS: Review of Systems  Constitutional: Negative.   HENT: Negative.   Respiratory: Positive for cough, sputum production and shortness of breath. Negative for hemoptysis and wheezing.   Cardiovascular: Negative.    Musculoskeletal: Negative.   Skin: Positive for rash.  Neurological: Negative.   Endo/Heme/Allergies: Positive for environmental allergies.  Psychiatric/Behavioral: Positive for substance abuse (nicotine dependence).     Medications: Current Outpatient Prescriptions  Medication Sig Dispense Refill  . Apremilast (OTEZLA PO) Take by mouth.    Marland Kitchen. aspirin EC 81 MG tablet Take 1 tablet (81 mg total) by mouth daily. 30 tablet 11  . atorvastatin (LIPITOR) 10 MG tablet Take 1 tablet (10 mg total) by mouth daily. 90 tablet 3  . Calcipotriene-Betameth Diprop (ENSTILAR EX) Apply topically.     No current facility-administered medications for this visit.    No Known Allergies     Objective:  BP 127/87   Pulse 74   Temp 98 F (36.7 C) (Oral)   Ht 5\' 9"  (1.753 m)   Wt 137 lb (62.1 kg)   SpO2 98%   BMI 20.23 kg/m  Gen: well-groomed, cooperative, not ill-appearing, no distress HEENT: normal conjunctiva and cornea clear, left TM obstructed by cerumen, right TM clear, nasal mucosa edematous, oropharynx with postnasal secretions, there is cobblestoning of the posterior tongue, uvula midline, no exudates, moist mucus membranes, no frontal or  maxillary sinus tenderness, neck supple, trachea midline Pulm: Normal work of breathing, normal phonation, rhonchi at the lung bases bilaterally CV: Normal rate, regular rhythm, s1 and s2 distinct, no murmurs, clicks or rubs  Neuro: alert and oriented x 3, normal tone, no tremor MSK: extremities atraumatic, normal gait and station, no peripheral edema Lymph: no cervical or tonsillar adenopathy Skin: warm, dry, intact; psoriatic plaques on bilateral arms, no cyanosis   No results found for this or any previous visit (from the past 72 hour(s)). No results found.  CLINICAL DATA:  Shortness of breath.  EXAM: CHEST  2 VIEW  COMPARISON:  No prior .  FINDINGS: Mediastinum and hilar structures normal. Lungs are clear. Density noted over the right  anterior first rib most likely prominent costochondral calcification, seen only on AP view. No pleural effusion or pneumothorax. Thoracic spine degenerative change.  IMPRESSION: No acute cardiopulmonary disease.   Electronically Signed   By: Maisie Fus  Register   On: 09/08/2016 14:19  Assessment and Plan: 41 y.o. male with   1. Chronic cough - rhonchi on exam in setting of tobacco use. Will treat as COPD exacerbation. There is likely an upper airway component as well. Treating allergic rhinitis symptoms with antihistamine and intranasal steroid - DG Chest 2 View to r/o infiltrate - pneumonia vaccine when symptom-free - albuterol (PROVENTIL HFA;VENTOLIN HFA) 108 (90 Base) MCG/ACT inhaler; Inhale 1 puff into the lungs every 4 (four) hours as needed for wheezing.  Dispense: 1 Inhaler; Refill: 0 - azithromycin (ZITHROMAX Z-PAK) 250 MG tablet; Take 2 tablets (500 mg) on  Day 1,  followed by 1 tablet (250 mg) once daily on Days 2 through 5.  Dispense: 6 tablet; Refill: 0 - predniSONE (DELTASONE) 50 MG tablet; One tab PO daily for 5 days.  Dispense: 5 tablet; Refill: 0  2. Tobacco use disorder - patient chose flexible quit approach with quit date of 12/23/16 - instructed to sign up for Get Quit plan - plan to complete Chantix for at least 12 weeks - varenicline (CHANTIX STARTING MONTH PAK) 0.5 MG X 11 & 1 MG X 42 tablet; Take one 0.5mg  tablet by mouth once daily for 3 days, then increase to one 0.5mg  tablet twice daily for 3 days, then increase to one 1mg  tablet twice daily.  Dispense: 53 tablet; Refill: 0 - varenicline (CHANTIX) 1 MG tablet; Take 1 tablet (1 mg total) by mouth 2 (two) times daily.  Dispense: 60 tablet; Refill: 2  3. Encounter for smoking cessation counseling - varenicline (CHANTIX STARTING MONTH PAK) 0.5 MG X 11 & 1 MG X 42 tablet; Take one 0.5mg  tablet by mouth once daily for 3 days, then increase to one 0.5mg  tablet twice daily for 3 days, then increase to one 1mg  tablet  twice daily.  Dispense: 53 tablet; Refill: 0 - varenicline (CHANTIX) 1 MG tablet; Take 1 tablet (1 mg total) by mouth 2 (two) times daily.  Dispense: 60 tablet; Refill: 2  4. Rhonchi - azithromycin (ZITHROMAX Z-PAK) 250 MG tablet; Take 2 tablets (500 mg) on  Day 1,  followed by 1 tablet (250 mg) once daily on Days 2 through 5.  Dispense: 6 tablet; Refill: 0 - predniSONE (DELTASONE) 50 MG tablet; One tab PO daily for 5 days.  Dispense: 5 tablet; Refill: 0  5. DOE (dyspnea on exertion) - albuterol (PROVENTIL HFA;VENTOLIN HFA) 108 (90 Base) MCG/ACT inhaler; Inhale 1 puff into the lungs every 4 (four) hours as needed for wheezing.  Dispense: 1 Inhaler; Refill:  0  6. Restless leg syndrome - rOPINIRole (REQUIP) 0.25 MG tablet; Take 1 tab PO 1 hour before bedtime. May increase to 2 tabs PO on days 3 to 7; week 2, 4 tabs PO; week 3, 6 tabs PO; week 4  Dispense: 90 tablet; Refill: 1 - follow-up in 4 weeks  7. Non-seasonal allergic rhinitis, unspecified trigger - cont oral antihistamine daily - triamcinolone (NASACORT) 55 MCG/ACT AERO nasal inhaler; Place 2 sprays into the nose daily.  Dispense: 1 Inhaler; Refill: 12  Patient education and anticipatory guidance given Patient agrees with treatment plan Follow-up in 4 weeks for RLS or sooner as needed if symptoms worsen or fail to improve  Levonne Hubert PA-C

## 2016-11-22 NOTE — Progress Notes (Signed)
Hi Darryl Vaughn,  Your chest x-ray does show a pneumonia in your left upper lung.  You received the treatment for this. Complete your antibiotic and steroid as prescribed.  I recommend a follow-up chest x-ray in 7 weeks to ensure resolution.  Best, Vinetta Bergamoharley

## 2016-12-06 ENCOUNTER — Encounter: Payer: Self-pay | Admitting: Physician Assistant

## 2016-12-10 ENCOUNTER — Other Ambulatory Visit: Payer: Self-pay | Admitting: Physician Assistant

## 2016-12-10 DIAGNOSIS — G2581 Restless legs syndrome: Secondary | ICD-10-CM

## 2016-12-13 ENCOUNTER — Ambulatory Visit (INDEPENDENT_AMBULATORY_CARE_PROVIDER_SITE_OTHER): Payer: Managed Care, Other (non HMO) | Admitting: Physician Assistant

## 2016-12-13 ENCOUNTER — Encounter: Payer: Self-pay | Admitting: Physician Assistant

## 2016-12-13 VITALS — BP 173/89 | HR 84 | Temp 98.1°F | Wt 136.0 lb

## 2016-12-13 DIAGNOSIS — J189 Pneumonia, unspecified organism: Secondary | ICD-10-CM

## 2016-12-13 DIAGNOSIS — J181 Lobar pneumonia, unspecified organism: Secondary | ICD-10-CM

## 2016-12-13 DIAGNOSIS — F17200 Nicotine dependence, unspecified, uncomplicated: Secondary | ICD-10-CM

## 2016-12-13 DIAGNOSIS — G2581 Restless legs syndrome: Secondary | ICD-10-CM | POA: Diagnosis not present

## 2016-12-13 HISTORY — DX: Pneumonia, unspecified organism: J18.9

## 2016-12-13 MED ORDER — PREDNISONE 50 MG PO TABS: ORAL_TABLET | ORAL | 0 refills | Status: DC

## 2016-12-13 MED ORDER — DOXYCYCLINE HYCLATE 100 MG PO TABS: 100.0000 mg | ORAL_TABLET | Freq: Two times a day (BID) | ORAL | 0 refills | Status: AC

## 2016-12-13 MED ORDER — ROPINIROLE HCL 0.5 MG PO TABS: 1.5000 mg | ORAL_TABLET | Freq: Every day | ORAL | 1 refills | Status: DC

## 2016-12-13 NOTE — Progress Notes (Signed)
HPI:                                                                Darryl Vaughn is a 41 y.o. male who [facility] today for cough  Patient with PMH of chronic cough, DOE, tobacco use (22 pack years) presents today complaining of cough, sore throat and nasal congestion x 1 week. He completed Azithromycin and Prednisone for a LUL pnuemonia approximately 2 weeks ago. Reports cough improved, but never fully resolved. Cough is productive of white sputum; no hemoptysis or shortness of breath. He reports he went tubing this weekend with family and drank river water that spilled into his drink. Reports other family members at home are sick with similar symptoms.  Patient is also requesting a refill of his Requip. States he is taking 1.5mg  nightly and this is working well for him. He is able to sleep through the night.   Past Medical History:  Diagnosis Date  . Chronic cough 11/22/2016  . DOE (dyspnea on exertion) 08/24/2016  . Dyslipidemia (high LDL; low HDL) 08/24/2016   09-WJ10-yr ASCVD risk 9.8%  . Pneumonia of left upper lobe due to infectious organism (HCC) 12/13/2016  . Pneumothorax 1998  . Psoriasis   . Restless leg syndrome 09/19/2016  . Shingles   . Tobacco use disorder 08/22/2016   Past Surgical History:  Procedure Laterality Date  . CHEST TUBE INSERTION    . MANDIBLE SURGERY    . VASECTOMY    . VASECTOMY     Social History  Substance Use Topics  . Smoking status: Current Every Day Smoker    Packs/day: 1.00    Years: 22.00    Types: Cigarettes  . Smokeless tobacco: Never Used  . Alcohol use No   family history includes Drug abuse in his mother; Heart attack in his father; Heart disease in his father and mother; Hypertension in his father.  ROS: negative except as noted in the HPI  Medications: Current Outpatient Prescriptions  Medication Sig Dispense Refill  . albuterol (PROVENTIL HFA;VENTOLIN HFA) 108 (90 Base)  MCG/ACT inhaler Inhale 1 puff into the lungs every 4 (four) hours as needed for wheezing. 1 Inhaler 0  . Apremilast (OTEZLA PO) Take by mouth.    Marland Kitchen. aspirin EC 81 MG tablet Take 1 tablet (81 mg total) by mouth daily. 30 tablet 11  . atorvastatin (LIPITOR) 10 MG tablet Take 1 tablet (10 mg total) by mouth daily. 90 tablet 3  . Calcipotriene-Betameth Diprop (ENSTILAR EX) Apply topically.    . triamcinolone (NASACORT) 55 MCG/ACT AERO nasal inhaler Place 2 sprays into the nose daily. 1 Inhaler 12  . doxycycline (VIBRA-TABS) 100 MG tablet Take 1 tablet (100 mg total) by mouth 2 (two) times daily. 14 tablet 0  . predniSONE (DELTASONE) 50 MG tablet One tab PO daily for 5 days. 5 tablet 0  . rOPINIRole (REQUIP) 0.5 MG tablet Take 3 tablets (1.5 mg total) by mouth at bedtime. 270 tablet 1   No current facility-administered medications for this visit.    No Known Allergies     Objective:  BP (!) 173/89   Pulse 84   Temp 98.1 F (36.7 C) (Oral)   Wt 136 lb (61.7  kg)   SpO2 99%   BMI 20.08 kg/m  Gen: well-groomed, cooperative, ill-appearing, not toxic-appearing, no acute distress HEENT: normal conjunctiva, TM's clear, nasal mucosa edematous, oropharynx clear, moist mucus membranes, no frontal or maxillary sinus tenderness, neck supple, trachea midline Pulm: Normal work of breathing, normal phonation, no stridor, clear to auscultation bilaterally, no wheezes, rales or rhonchi CV: Normal rate, regular rhythm, s1 and s2 distinct, no murmurs, clicks or rubs  Neuro: alert and oriented x 3, no tremor MSK: extremities atraumatic, normal gait and station Lymph: no cervical or tonsillar adenopathy Skin: warm, dry, intact; no cyanosis  No results found for this or any previous visit (from the past 72 hour(s)). No results found.    Assessment and Plan: 41 y.o. male with   1. Pneumonia of left upper lobe due to infectious organism [facility]) - second sickening versus new URI. Will cover for CAP with  Doxycycline and Prednisone. - SpO2 99% on RA at rest - plan to obtain follow-up CXR in 6 weeks to ensure resolution of CAP - doxycycline (VIBRA-TABS) 100 MG tablet; Take 1 tablet (100 mg total) by mouth 2 (two) times daily.  Dispense: 14 tablet; Refill: 0 - predniSONE (DELTASONE) 50 MG tablet; One tab PO daily for 5 days.  Dispense: 5 tablet; Refill: 0  2. Tobacco use disorder - self-discontinued Chantix due to irritability  3. Restless leg syndrome - refilled Requip 1.5mg  [facility]   Patient education and anticipatory guidance given Patient agrees with treatment plan Follow-up in 1 week or sooner as needed if symptoms worsen or fail to improve  Levonne Hubert PA-C

## 2016-12-19 ENCOUNTER — Other Ambulatory Visit: Payer: Self-pay | Admitting: Physician Assistant

## 2016-12-19 DIAGNOSIS — R053 Chronic cough: Secondary | ICD-10-CM

## 2016-12-19 DIAGNOSIS — R0609 Other forms of dyspnea: Secondary | ICD-10-CM

## 2016-12-19 DIAGNOSIS — R05 Cough: Secondary | ICD-10-CM

## 2016-12-20 ENCOUNTER — Ambulatory Visit: Payer: Managed Care, Other (non HMO) | Admitting: Physician Assistant

## 2016-12-31 ENCOUNTER — Encounter: Payer: Self-pay | Admitting: Physician Assistant

## 2017-03-14 ENCOUNTER — Telehealth: Payer: Managed Care, Other (non HMO) | Admitting: Family

## 2017-03-14 DIAGNOSIS — R42 Dizziness and giddiness: Secondary | ICD-10-CM

## 2017-03-14 DIAGNOSIS — J069 Acute upper respiratory infection, unspecified: Secondary | ICD-10-CM

## 2017-03-14 MED ORDER — MECLIZINE HCL 25 MG PO TABS: 25.0000 mg | ORAL_TABLET | Freq: Three times a day (TID) | ORAL | 0 refills | Status: DC | PRN

## 2017-03-14 MED ORDER — FLUTICASONE PROPIONATE 50 MCG/ACT NA SUSP: 2.0000 | Freq: Every day | NASAL | 6 refills | Status: DC

## 2017-03-14 NOTE — Progress Notes (Signed)
We are sorry you are not feeling well.  Here is how we plan to help!  Based on what you have shared with me, it looks like you may have a viral upper respiratory infection or a "common cold".  Colds are caused by a large number of viruses; however, rhinovirus is the most common cause.   Symptoms of the common cold vary from person to person, with common symptoms including sore throat, cough, and malaise.  A low-grade fever of 100.4 may present, but is often uncommon.  Symptoms vary however, and are closely related to a person's age or underlying illnesses.  The most common symptoms associated with the common cold are nasal discharge or congestion, cough, sneezing, headache and pressure in the ears and face.  Cold symptoms usually persist for about 3 to 10 days, but can last up to 2 weeks.  It is important to know that colds do not cause serious illness or complications in most cases.    The common cold is transmitted from person to person, with the most common method of transmission being a person's hands.  The virus is able to live on the skin and can infect other persons for up to 2 hours after direct contact.  Also, colds are transmitted when someone coughs or sneezes; thus, it is important to cover the mouth to reduce this risk.  To keep the spread of the common cold at bay, good hand hygiene is very important.  This is an infection that is most likely caused by a virus. There are no specific treatments for the common cold other than to help you with the symptoms until the infection runs its course.    For nasal congestion, you may use an oral decongestants such as Mucinex D or if you have glaucoma or high blood pressure use plain Mucinex.  Saline nasal spray or nasal drops can help and can safely be used as often as needed for congestion.  For your congestion, I have prescribed Fluticasone nasal spray one spray in each nostril twice a day  If you do not have a history of heart disease, hypertension,  diabetes or thyroid disease, prostate/bladder issues or glaucoma, you may also use Sudafed to treat nasal congestion.  It is highly recommended that you consult with a pharmacist or your primary care physician to ensure this medication is safe for you to take.     If you have a cough, you may use cough suppressants such as Delsym and Robitussin.  If you have glaucoma or high blood pressure, you can also use Coricidin HBP.    I have also sent in Antivert 25 mg three times a day as needed for dizziness. If your dizziness Does not improve or worsens or any change in your speech, walk,talk you need to go to the ED.   If you have a sore or scratchy throat, use a saltwater gargle-  to  teaspoon of salt dissolved in a 4-ounce to 8-ounce glass of warm water.  Gargle the solution for approximately 15-30 seconds and then spit.  It is important not to swallow the solution.  You can also use throat lozenges/cough drops and Chloraseptic spray to help with throat pain or discomfort.  Warm or cold liquids can also be helpful in relieving throat pain.  For headache, pain or general discomfort, you can use Ibuprofen or Tylenol as directed.   Some authorities believe that zinc sprays or the use of Echinacea may shorten the course of your symptoms.  HOME CARE . Only take medications as instructed by your medical team. . Be sure to drink plenty of fluids. Water is fine as well as fruit juices, sodas and electrolyte beverages. You may want to stay away from caffeine or alcohol. If you are nauseated, try taking small sips of liquids. How do you know if you are getting enough fluid? Your urine should be a pale yellow or almost colorless. . Get rest. . Taking a steamy shower or using a humidifier may help nasal congestion and ease sore throat pain. You can place a towel over your head and breathe in the steam from hot water coming from a faucet. . Using a saline nasal spray works much the same way. . Cough drops,  hard candies and sore throat lozenges may ease your cough. . Avoid close contacts especially the very young and the elderly . Cover your mouth if you cough or sneeze . Always remember to wash your hands.   GET HELP RIGHT AWAY IF: . You develop worsening fever. . If your symptoms do not improve within 10 days . You become short of breath. . You develop yellow or green discharge from your nose over 3 days. . You have coughing fits . You develop a severe head ache or visual changes. . You develop shortness of breath or difficulty breathing. . Your symptoms persist after you have completed your treatment plan  MAKE SURE YOU   Understand these instructions.  Will watch your condition.  Will get help right away if you are not doing well or get worse.  Your e-visit answers were reviewed by a board certified advanced clinical practitioner to complete your personal care plan. Depending upon the condition, your plan could have included both over the counter or prescription medications. Please review your pharmacy choice. If there is a problem, you may call our nursing hot line at and have the prescription routed to another pharmacy. Your safety is important to us. If you have drug allergies check your prescription carefully.   You can use MyChart to ask questions about today's visit, request a non-urgent call back, or ask for a work or school excuse for 24 hours related to this e-Visit. If it has been greater than 24 hours you will need to follow up with your provider, or enter a new e-Visit to address those concerns. You will get an e-mail in the next two days asking about your experience.  I hope that your e-visit has been valuable and will speed your recovery. Thank you for using e-visits.

## 2017-06-07 ENCOUNTER — Other Ambulatory Visit: Payer: Self-pay | Admitting: Physician Assistant

## 2017-06-07 DIAGNOSIS — G2581 Restless legs syndrome: Secondary | ICD-10-CM

## 2017-06-07 NOTE — Telephone Encounter (Signed)
Is this refill appropriate? -EH/RMA  

## 2017-08-20 ENCOUNTER — Other Ambulatory Visit: Payer: Self-pay | Admitting: Physician Assistant

## 2017-08-20 DIAGNOSIS — E785 Hyperlipidemia, unspecified: Secondary | ICD-10-CM

## 2017-08-29 ENCOUNTER — Other Ambulatory Visit: Payer: Self-pay | Admitting: Physician Assistant

## 2017-08-29 DIAGNOSIS — G2581 Restless legs syndrome: Secondary | ICD-10-CM

## 2017-11-07 ENCOUNTER — Other Ambulatory Visit: Payer: Self-pay

## 2017-11-07 ENCOUNTER — Encounter: Payer: Self-pay | Admitting: Emergency Medicine

## 2017-11-07 ENCOUNTER — Emergency Department (INDEPENDENT_AMBULATORY_CARE_PROVIDER_SITE_OTHER)
Admission: EM | Admit: 2017-11-07 | Discharge: 2017-11-07 | Disposition: A | Discharge status: Home / Self Care | Payer: BLUE CROSS/BLUE SHIELD | Source: Home / Self Care | Attending: Family Medicine | Admitting: Family Medicine

## 2017-11-07 DIAGNOSIS — S61318A Laceration without foreign body of other finger with damage to nail, initial encounter: Secondary | ICD-10-CM | POA: Diagnosis not present

## 2017-11-07 DIAGNOSIS — Z23 Encounter for immunization: Secondary | ICD-10-CM

## 2017-11-07 MED ORDER — CEPHALEXIN 500 MG PO CAPS: 500.0000 mg | ORAL_CAPSULE | Freq: Two times a day (BID) | ORAL | 0 refills | Status: DC

## 2017-11-07 NOTE — ED Triage Notes (Signed)
42 y.o male c/o of left index finger pain and bleeding after cutting on knife. States TDAP is 355-43 years old.

## 2017-11-07 NOTE — ED Notes (Signed)
TDAP given in LEFT deltoid (requested by pt) tolerated well.

## 2017-11-07 NOTE — Discharge Instructions (Signed)
°  You should keep the bandage in place for at least 24 hours (unless it becomes dirty or wet).  Then gently remove and gently clean wound with warm water and mild soap. There is no need for peroxide as this kills new skin cells too.    Avoid soaking your finger. You should keep the wound covered with a bandage to help keep protected until it has healed.  Please follow up in 10-14 days with you family doctor or urgent care for suture removal, sooner if concern for infection.

## 2017-11-07 NOTE — ED Provider Notes (Signed)
[facility]    CSN: [id number] Arrival date & time: 11/07/17  4098     History   Chief Complaint Chief Complaint  Patient presents with  . Laceration    HPI Darryl Vaughn is a 42 y.o. male.   HPI  Darryl Vaughn is a 42 y.o. male presenting to UC with c/o Left index finger laceration that occurred just PTA. Pt accidentally cut his finger with a large pocket knife while cutting the top off a caulking tube that rolled.  Bleeding controlled PTA. Pain is mild to moderate, aching and throbbing. Last Tdap 5-6 years ago. He would like to update it.   Past Medical History:  Diagnosis Date  . Chronic cough 11/22/2016  . DOE (dyspnea on exertion) 08/24/2016  . Dyslipidemia (high LDL; low HDL) 08/24/2016   11-BJ ASCVD risk 9.8%  . Pneumonia of left upper lobe due to infectious organism (HCC) 12/13/2016  . Pneumothorax 1998  . Psoriasis   . Restless leg syndrome 09/19/2016  . Shingles   . Tobacco use disorder 08/22/2016    Patient Active Problem List   Diagnosis Date Noted  . Pneumonia of left upper lobe due to infectious organism (HCC) 12/13/2016  . Chronic cough 11/22/2016  . Rhonchi 11/22/2016  . Non-seasonal allergic rhinitis 11/22/2016  . Restless leg syndrome 09/19/2016  . DOE (dyspnea on exertion) 08/24/2016  . Dyslipidemia (high LDL; low HDL) 08/24/2016  . Tobacco use disorder 08/22/2016  . Family history of heart attack 08/22/2016  . Psoriasis 08/22/2016    Past Surgical History:  Procedure Laterality Date  . CHEST TUBE INSERTION    . MANDIBLE SURGERY    . VASECTOMY    . VASECTOMY         Home Medications    Prior to Admission medications   Medication Sig Start Date End Date Taking? Authorizing Provider  albuterol (PROVENTIL HFA;VENTOLIN HFA) 108 (90 Base) MCG/ACT inhaler Inhale 1 puff into the lungs every 4 hours as needed for wheezing.  12/19/16   Carlis Stable, PA-C  Apremilast (OTEZLA PO) Take by mouth.    [provider]   aspirin EC 81 MG tablet Take 1 tablet (81 mg total) by mouth daily. 09/19/16   Carlis Stable, PA-C  atorvastatin (LIPITOR) 10 MG tablet Take 1 tablet (10 mg total) by mouth daily. Needs appt for further refills 08/20/17   Carlis Stable, PA-C  Calcipotriene-Betameth Diprop (ENSTILAR EX) Apply topically.    [provider]  cephALEXin (KEFLEX) 500 MG capsule Take 1 capsule (500 mg total) by mouth 2 (two) times daily. 11/07/17   Lurene Shadow, PA-C  fluticasone (FLONASE) 50 MCG/ACT nasal spray Place 2 sprays into both nostrils daily. 03/14/17   Junie Spencer, FNP  meclizine (ANTIVERT) 25 MG tablet Take 1 tablet (25 mg total) by mouth 3 (three) times daily as needed for dizziness. 03/14/17   Jannifer Rodney A, FNP  predniSONE (DELTASONE) 50 MG tablet One tab PO daily for 5 days. 12/13/16   Carlis Stable, PA-C  rOPINIRole (REQUIP) 0.5 MG tablet TAKE 3 TABLETS (1.5 MG TOTAL) BY MOUTH AT BEDTIME. 08/30/17   Carlis Stable, PA-C  triamcinolone (NASACORT) 55 MCG/ACT AERO nasal inhaler Place 2 sprays into the nose daily. 11/22/16   Carlis Stable, PA-C    Family History Family History  Problem Relation Age of Onset  . Heart disease Mother   . Drug abuse Mother   . Heart disease Father   .  Heart attack Father   . Hypertension Father     Social History Social History   Tobacco Use  . Smoking status: Current Every Day Smoker    Packs/day: 1.00    Years: 22.00    Pack years: 22.00    Types: Cigarettes  . Smokeless tobacco: Never Used  Substance Use Topics  . Alcohol use: No  . Drug use: No     Allergies   Patient has no known allergies.   Review of Systems Review of Systems  Musculoskeletal: Negative for arthralgias and joint swelling.  Skin: Positive for wound.  Neurological: Negative for weakness and numbness.     Physical Exam Triage Vital Signs ED Triage Vitals [11/07/17 0949]  Enc Vitals Group     BP  117/78     Pulse Rate 79     Resp      Temp 97.8 F (36.6 C)     Temp Source Oral     SpO2 98 %     Weight 140 lb (63.5 kg)     Height 5\' 7"  (1.702 m)     Head Circumference      Peak Flow      Pain Score 5     Pain Loc      Pain Edu?      Excl. in GC?    No data found.  Updated Vital Signs BP 117/78 (BP Location: Right Arm)   Pulse 79   Temp 97.8 F (36.6 C) (Oral)   Ht 5\' 7"  (1.702 m)   Wt 140 lb (63.5 kg)   SpO2 98%   BMI 21.93 kg/m   Visual Acuity Right Eye Distance:   Left Eye Distance:   Bilateral Distance:    Right Eye Near:   Left Eye Near:    Bilateral Near:     Physical Exam  Constitutional: He is oriented to person, place, and time. He appears well-developed and well-nourished. No distress.  HENT:  Head: Normocephalic and atraumatic.  Eyes: EOM are normal.  Neck: Normal range of motion.  Cardiovascular: Normal rate.  Pulmonary/Chest: Effort normal.  Musculoskeletal: Normal range of motion. He exhibits no edema or tenderness.       Hands: Neurological: He is alert and oriented to person, place, and time.  Skin: Skin is warm and dry. Capillary refill takes less than 2 seconds. He is not diaphoretic.  Left index finger: 1cm laceration over dorsal distal aspect through nail but does not involve nail matrix. Small skin flap at distal aspect.   Psychiatric: He has a normal mood and affect. His behavior is normal.  Nursing note and vitals reviewed.    UC Treatments / Results  Labs (all labs ordered are listed, but only abnormal results are displayed) Labs Reviewed - No data to display  EKG None  Radiology No results found.  Procedures Laceration Repair Date/Time: 11/07/2017 12:13 PM Performed by: Lurene Shadow, PA-C Authorized by: Lattie Haw, MD   Consent:    Consent obtained:  Verbal   Consent given by:  Patient   Risks discussed:  Infection, pain, poor cosmetic result and poor wound healing   Alternatives discussed: pressure  bandage only. Anesthesia (see MAR for exact dosages):    Anesthesia method:  Nerve block   Block needle gauge:  25 G   Block anesthetic:  Lidocaine 1% w/o epi   Block technique:  Digital   Block injection procedure:  Anatomic landmarks identified, introduced needle, incremental injection, negative aspiration  for blood and anatomic landmarks palpated   Block outcome:  Anesthesia achieved Laceration details:    Location:  Finger   Finger location:  L index finger   Length (cm):  1   Depth (mm):  2 Repair type:    Repair type:  Simple Pre-procedure details:    Preparation:  Patient was prepped and draped in usual sterile fashion Exploration:    Hemostasis achieved with:  Direct pressure   Wound exploration: wound explored through full range of motion and entire depth of wound probed and visualized     Wound extent: no areolar tissue violation noted, no fascia violation noted, no foreign bodies/material noted, no muscle damage noted, no nerve damage noted, no tendon damage noted, no underlying fracture noted and no vascular damage noted     Contaminated: no   Treatment:    Area cleansed with:  Saline and Hibiclens   Amount of cleaning:  Standard Skin repair:    Repair method:  Sutures   Suture size:  5-0   Wound skin closure material used: ethylon.   Suture technique:  Simple interrupted   Number of sutures:  3 Approximation:    Approximation:  Close Post-procedure details:    Dressing:  Bulky dressing and antibiotic ointment   Patient tolerance of procedure:  Tolerated well, no immediate complications   (including critical care time)  Medications Ordered in UC Medications - No data to display  Initial Impression / Assessment and Plan / UC Course  I have reviewed the triage vital signs and the nursing notes.  Pertinent labs & imaging results that were available during my care of the patient were reviewed by me and considered in my medical decision making (see chart for  details).     Laceration to Left index finger, involves nailbed but not the matrix. No bone exposure. Wound sutured without complication. 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  Final Clinical Impressions(s) / UC Diagnoses   Final diagnoses:  Laceration of index finger without foreign body with damage to nail, initial encounter     Discharge Instructions      You should keep the bandage in place for at least 24 hours (unless it becomes dirty or wet).  Then gently remove and gently clean wound with warm water and mild soap. There is no need for peroxide as this kills new skin cells too.    Avoid soaking your finger. You should keep the wound covered with a bandage to help keep protected until it has healed.  Please follow up in 10-14 days with you family doctor or urgent care for suture removal, sooner if concern for infection.     ED Prescriptions    Medication Sig Dispense Auth. Provider   cephALEXin (KEFLEX) 500 MG capsule Take 1 capsule (500 mg total) by mouth 2 (two) times daily. 14 capsule Lurene ShadowPhelps, Merna Baldi O, PA-C     Controlled Substance Prescriptions [place] Controlled Substance Registry consulted? Not Applicable   Rolla Platehelps, Octavian Godek O, PA-C 11/07/17 1217

## 2017-11-14 ENCOUNTER — Ambulatory Visit (INDEPENDENT_AMBULATORY_CARE_PROVIDER_SITE_OTHER): Payer: BLUE CROSS/BLUE SHIELD | Admitting: Physician Assistant

## 2017-11-14 ENCOUNTER — Encounter: Payer: Self-pay | Admitting: Physician Assistant

## 2017-11-14 VITALS — BP 139/72 | HR 76 | Temp 97.8°F | Wt 141.0 lb

## 2017-11-14 DIAGNOSIS — Z4802 Encounter for removal of sutures: Secondary | ICD-10-CM

## 2017-11-14 DIAGNOSIS — G2581 Restless legs syndrome: Secondary | ICD-10-CM

## 2017-11-14 MED ORDER — ROPINIROLE HCL 1 MG PO TABS: 2.0000 mg | ORAL_TABLET | Freq: Every day | ORAL | 1 refills | Status: DC

## 2017-11-14 NOTE — Patient Instructions (Signed)
Suture Removal, Care After Refer to this sheet in the next few weeks. These instructions provide you with information on caring for yourself after your procedure. Your health care provider may also give you more specific instructions. Your treatment has been planned according to current medical practices, but problems sometimes occur. Call your health care provider if you have any problems or questions after your procedure. What can I expect after the procedure? After your stitches (sutures) are removed, it is typical to have the following:  Some discomfort and swelling in the wound area.  Slight redness in the area.  Follow these instructions at home:  If you have skin adhesive strips over the wound area, do not take the strips off. They will fall off on their own in a few days. If the strips remain in place after 14 days, you may remove them.  Change any bandages (dressings) at least once a day or as directed by your health care provider. If the bandage sticks, soak it off with warm, soapy water.  Apply cream or ointment only as directed by your health care provider. If using cream or ointment, wash the area with soap and water 2 times a day to remove all the cream or ointment. Rinse off the soap and pat the area dry with a clean towel.  Keep the wound area dry and clean. If the bandage becomes wet or dirty, or if it develops a bad smell, change it as soon as possible.  Continue to protect the wound from injury.  Use sunscreen when out in the sun. New scars become sunburned easily. Contact a health care provider if:  You have increasing redness, swelling, or pain in the wound.  You see pus coming from the wound.  You have a fever.  You notice a bad smell coming from the wound or dressing.  Your wound breaks open (edges not staying together). This information is not intended to replace advice given to you by your health care provider. Make sure you discuss any questions you have  with your health care provider. Document Released: 12/20/2000 Document Revised: 09/02/2015 Document Reviewed: 11/06/2012 Elsevier Interactive Patient Education  2017 [employer]  

## 2017-11-14 NOTE — Progress Notes (Signed)
HPI:                                                                Darryl Vaughn is a 42 y.o. male who [facility] Primary [facility] Medicine today for wound check  Patient sustained a laceration of the distal left index finger by pocket knife approx 1 week ago. Laceration was repaired in UC on 11/07/17 with 3 simple interrupted Nylon sutures. He was also placed on Keflex for prophylaxis due to the nature of his occupation. Tdap was updated in UC.   Presents today for wound check. Denies fever, wound drainage, streaking redness or pain. He has noticed that finger is numb where it was sutured.  Also reports RLS has been worse lately, especially in the left leg. Denies lower extremity pain, paresthesias/altered sensation. Currently on Requip 1.5 mg at bedtime.   [facility] 08/[facility]/2016  Decreased Interest 0  Down, Depressed, Hopeless 0  PHQ - 2 Score 0    No flowsheet data found.    Past Medical History:  Diagnosis Date  . Chronic cough 11/22/2016  . DOE (dyspnea on exertion) 08/24/2016  . Dyslipidemia (high LDL; low HDL) 08/24/2016   16-XW10-yr ASCVD risk 9.8%  . Pneumonia of left upper lobe due to infectious organism (HCC) 12/13/2016  . Pneumothorax 1998  . Psoriasis   . Restless leg syndrome 09/19/2016  . Shingles   . Tobacco use disorder 08/22/2016   Past Surgical History:  Procedure Laterality Date  . CHEST TUBE INSERTION    . MANDIBLE SURGERY    . VASECTOMY    . VASECTOMY     Social History   Tobacco Use  . Smoking status: Current Every Day Smoker    Packs/day: 1.00    Years: 22.00    Pack years: 22.00    Types: Cigarettes  . Smokeless tobacco: Never Used  Substance Use Topics  . Alcohol use: No   family history includes Drug abuse in his mother; Heart attack in his father; Heart disease in his father and mother; Hypertension in his father.    ROS: negative except as noted in the HPI  Medications: Current Outpatient  Medications  Medication Sig Dispense Refill  . albuterol (PROVENTIL HFA;VENTOLIN HFA) 108 (90 Base) MCG/ACT inhaler Inhale 1 puff into the lungs every 4 hours as needed for wheezing.  8.5 Inhaler 0  . Apremilast (OTEZLA PO) Take by mouth.    Marland Kitchen. aspirin EC 81 MG tablet Take 1 tablet (81 mg total) by mouth daily. 30 tablet 11  . atorvastatin (LIPITOR) 10 MG tablet Take 1 tablet (10 mg total) by mouth daily. Needs appt for further refills 90 tablet 0  . Calcipotriene-Betameth Diprop (ENSTILAR EX) Apply topically.    . cephALEXin (KEFLEX) 500 MG capsule Take 1 capsule (500 mg total) by mouth 2 (two) times daily. 14 capsule 0  . fluticasone (FLONASE) 50 MCG/ACT nasal spray Place 2 sprays into both nostrils daily. 16 g 6  . rOPINIRole (REQUIP) 1 MG tablet Take 2 tablets (2 mg total) by mouth at bedtime. 180 tablet 1  . triamcinolone (NASACORT) 55 MCG/ACT AERO nasal inhaler Place 2 sprays into the nose daily. 1 Inhaler 12   No current facility-administered medications for this  visit.    No Known Allergies     Objective:  BP 139/72   Pulse 76   Temp 97.8 F (36.6 C) (Oral)   Wt 141 lb (64 kg)   BMI 22.08 kg/m  Gen:  alert, not ill-appearing, no distress, appropriate for age HEENT: head normocephalic without obvious abnormality, conjunctiva and cornea clear, trachea midline Pulm: Normal work of breathing, normal phonation Neuro: alert and oriented x 3, loss of sensation of dorsal distal aspect of first phalanx MSK: extremities atraumatic, normal gait and station Skin: distal left first phalanx - avulsion of distal nail with eschar excluding nail matrix, approx 1 cm healed laceration with surround ecchymoses  No results found for this or any previous visit (from the past 72 hour(s)). No results found.    Assessment and Plan: 42 y.o. male with   .Itay was seen today for wound check.  Diagnoses and all orders for this visit:  Visit for suture removal  Restless leg syndrome -      rOPINIRole (REQUIP) 1 MG tablet; Take 2 tablets (2 mg total) by mouth at bedtime.   - 3 simple interrupted sutures removed. Patient counseled on after care. Also counseled that avulsed portion of nail will likely fall off.  -increasing Requip to 2 mg QHS  Patient education and anticipatory guidance given Patient agrees with treatment plan Follow-up in 2 months for RLS or sooner as needed if symptoms worsen or fail to improve  Levonne Hubert PA-C

## 2017-11-23 ENCOUNTER — Other Ambulatory Visit: Payer: Self-pay | Admitting: Physician Assistant

## 2017-11-23 DIAGNOSIS — E785 Hyperlipidemia, unspecified: Secondary | ICD-10-CM

## 2017-11-27 ENCOUNTER — Other Ambulatory Visit: Payer: Self-pay | Admitting: Physician Assistant

## 2017-11-27 DIAGNOSIS — G2581 Restless legs syndrome: Secondary | ICD-10-CM

## 2018-01-04 IMAGING — DX DG CHEST 2V
2 series · 2 of 2 positions shown · non-contrast
Comparison: 09/08/2016

CLINICAL DATA: Cough for 2 weeks

EXAM:
CHEST  2 VIEW

[chest pa]
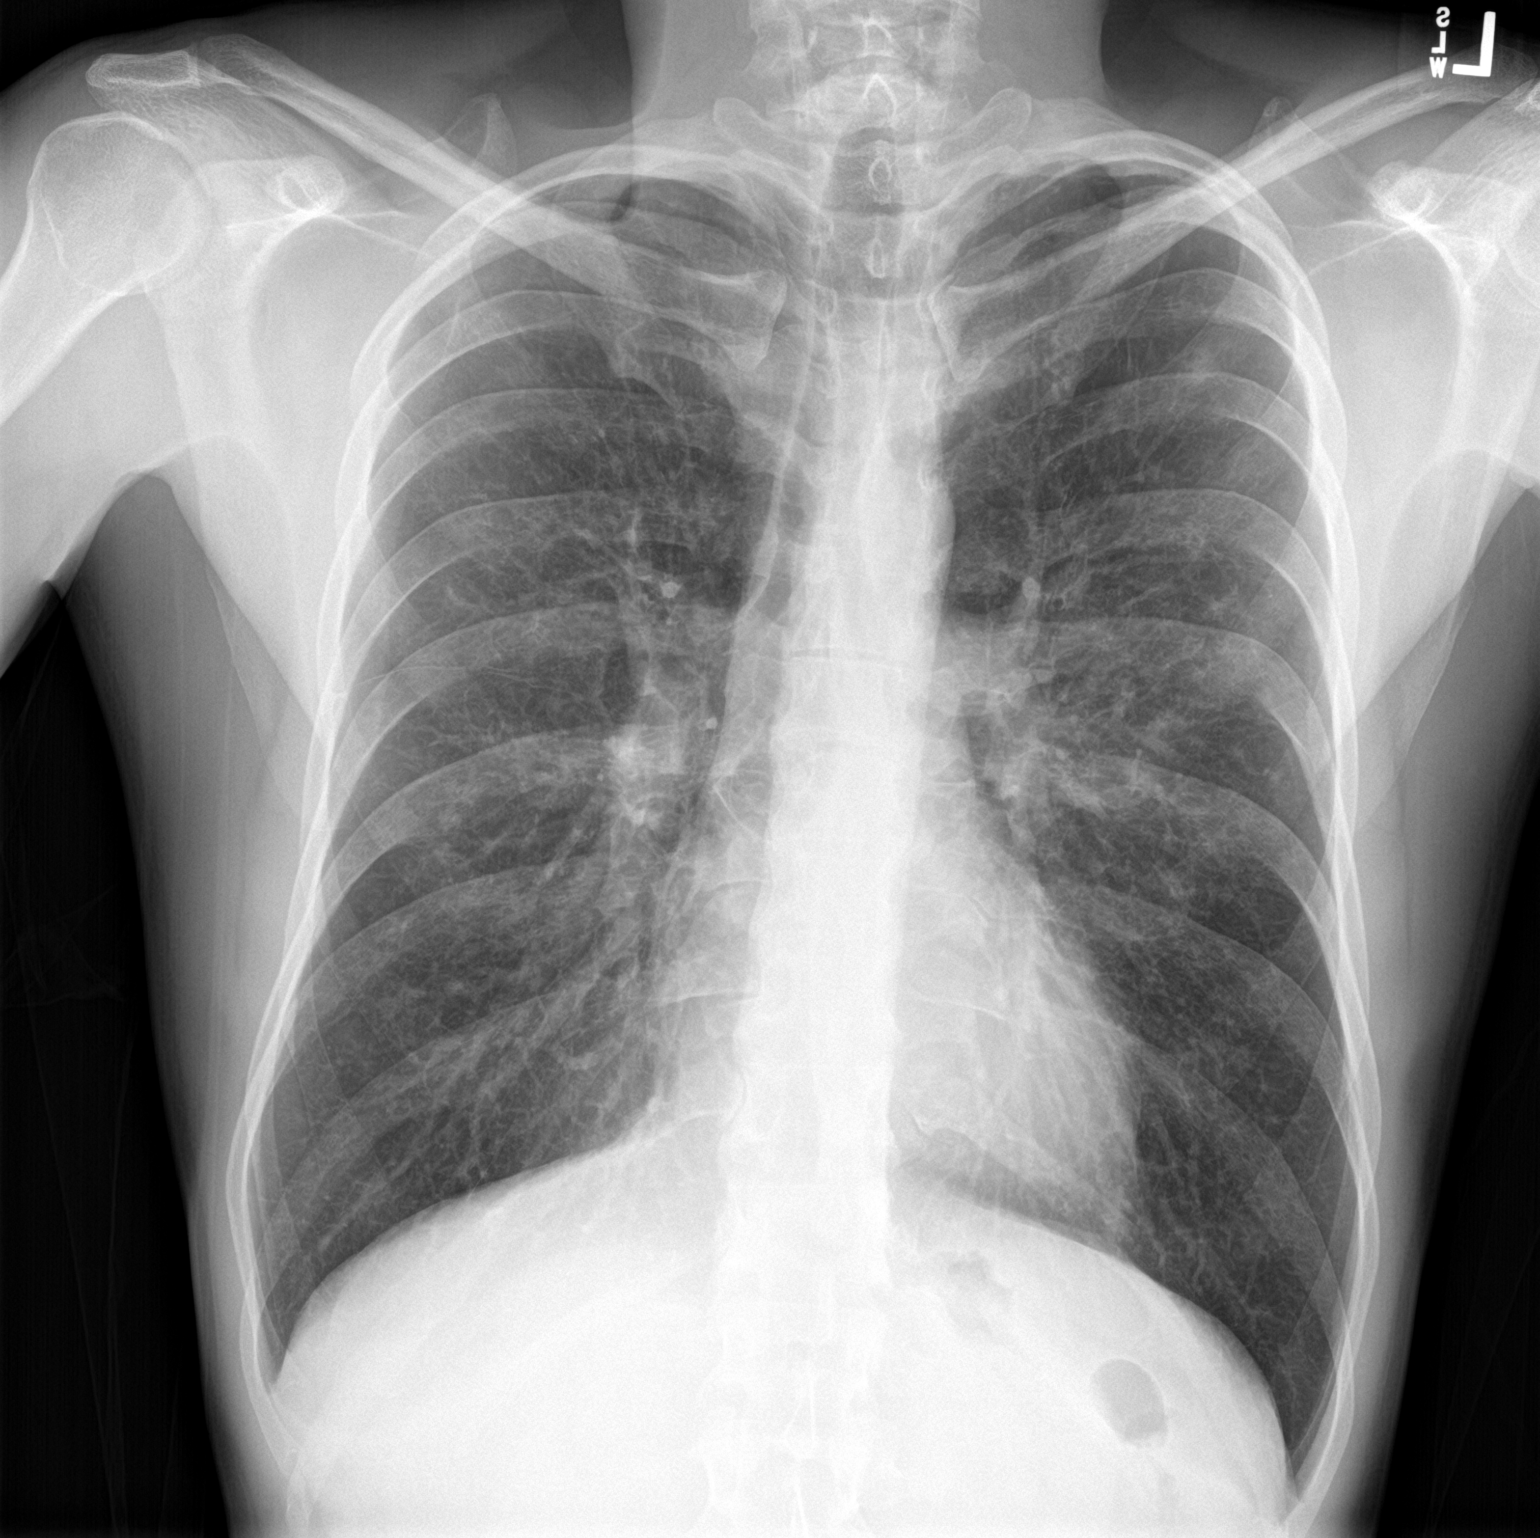

[chest lat]
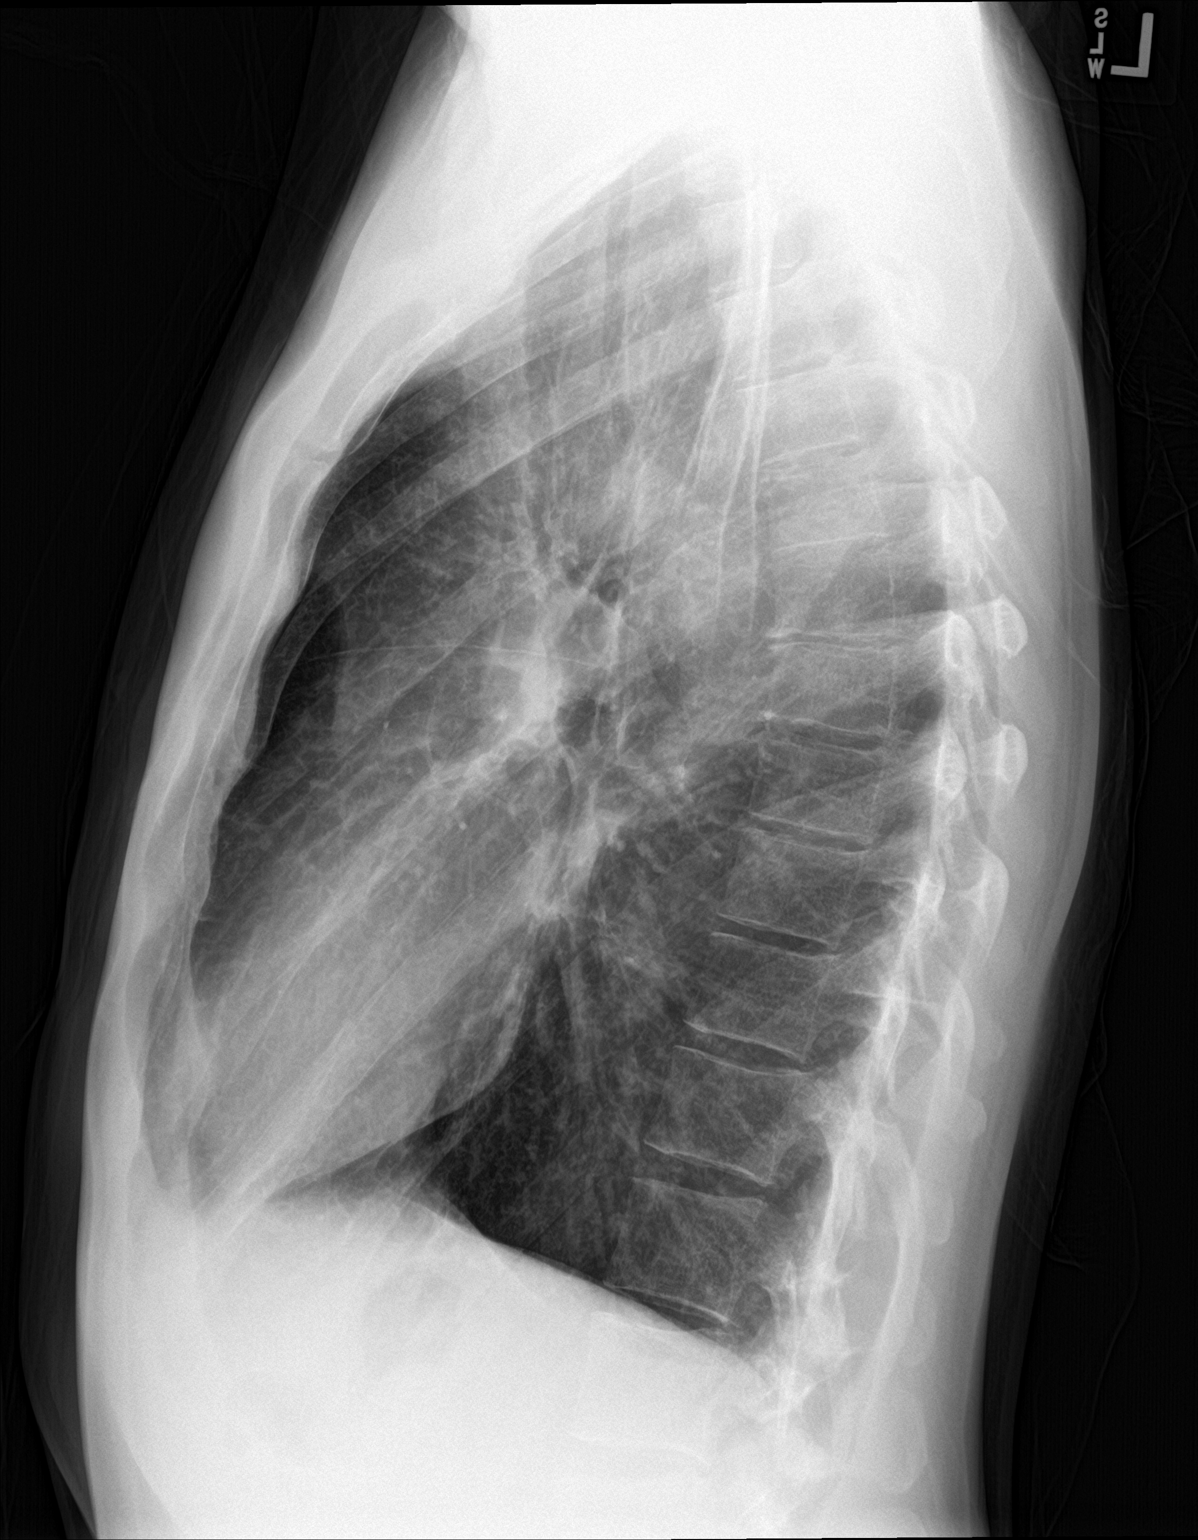

[2 of 2 positions shown; findings below may reference images not displayed]

FINDINGS: Hyperinflation of the lungs. Possible patchy early infiltrate in the
left upper lobe. Nodular density projects over the left upper lobe/
apex which may also be a component of the upper lobe infiltrate.
Right lung is clear. No effusions. Heart is normal size.
IMPRESSION: Concern for left upper lobe developing infiltrate/pneumonia. At
least a portion of this appears nodular. Followup PA and lateral
chest X-ray is recommended in 3-4 weeks following trial of
antibiotic therapy to ensure resolution and exclude underlying
malignancy.

Mild hyperinflation.

## 2018-01-14 ENCOUNTER — Ambulatory Visit: Payer: BLUE CROSS/BLUE SHIELD | Admitting: Physician Assistant

## 2018-01-16 ENCOUNTER — Other Ambulatory Visit: Payer: Self-pay

## 2018-01-16 ENCOUNTER — Emergency Department (INDEPENDENT_AMBULATORY_CARE_PROVIDER_SITE_OTHER)
Admission: EM | Admit: 2018-01-16 | Discharge: 2018-01-16 | Disposition: A | Discharge status: Home / Self Care | Payer: 59 | Source: Home / Self Care | Attending: Family Medicine | Admitting: Family Medicine

## 2018-01-16 ENCOUNTER — Encounter: Payer: Self-pay | Admitting: *Deleted

## 2018-01-16 DIAGNOSIS — Z4800 Encounter for change or removal of nonsurgical wound dressing: Secondary | ICD-10-CM

## 2018-01-16 DIAGNOSIS — Z5189 Encounter for other specified aftercare: Secondary | ICD-10-CM

## 2018-01-16 NOTE — Discharge Instructions (Signed)
°  Keep wound clean with warm water and mild soap. DO NOT soak your wound in a tub, pool, hot tub, lake, ocean, etc. Pat dry. You may apply antibiotic ointment for 3-5 days, a non-stick bandage. Change bandage 2-3 times daily. Please follow up with family medicine or employee health if needed If concern for infection- increased pain, redness, drainage of pus, fever.

## 2018-01-16 NOTE — ED Triage Notes (Signed)
Pt is here today for a follow up of his LT thigh laceration from the ED yesterday workers comp injury.

## 2018-01-16 NOTE — ED Provider Notes (Signed)
[facility]    CSN: [id number] Arrival date & time: 01/16/18  1949     History   Chief Complaint Chief Complaint  Patient presents with  . Wound Check    HPI Precious Gilchrest is a 42 y.o. male.   HPI Nick Stults is a 42 y.o. male presenting to UC with concern his bandage is stuck to a wound on his leg.  Pt reports cutting his Left thigh with a chainsaw at work yesterday. He was seen in the ED and had the wound sutured closed and a bandage applied. He was advised to keep the wound covered for 48 hours and to keep dry.  He was trying to remove the bandage this evening to change the bandage but states the bandage is stuck to the blood. The injury did occur at work but he decide to not file workers comp today.   Past Medical History:  Diagnosis Date  . Chronic cough 11/22/2016  . DOE (dyspnea on exertion) 08/24/2016  . Dyslipidemia (high LDL; low HDL) 08/24/2016   40-JW ASCVD risk 9.8%  . Pneumonia of left upper lobe due to infectious organism (HCC) 12/13/2016  . Pneumothorax 1998  . Psoriasis   . Restless leg syndrome 09/19/2016  . Shingles   . Tobacco use disorder 08/22/2016    Patient Active Problem List   Diagnosis Date Noted  . Pneumonia of left upper lobe due to infectious organism (HCC) 12/13/2016  . Chronic cough 11/22/2016  . Rhonchi 11/22/2016  . Non-seasonal allergic rhinitis 11/22/2016  . Restless leg syndrome 09/19/2016  . DOE (dyspnea on exertion) 08/24/2016  . Dyslipidemia (high LDL; low HDL) 08/24/2016  . Tobacco use disorder 08/22/2016  . Family history of heart attack 08/22/2016  . Psoriasis 08/22/2016    Past Surgical History:  Procedure Laterality Date  . CHEST TUBE INSERTION    . MANDIBLE SURGERY    . VASECTOMY    . VASECTOMY         Home Medications    Prior to Admission medications   Medication Sig Start Date End Date Taking? Authorizing Provider  albuterol (PROVENTIL HFA;VENTOLIN HFA) 108 (90 Base) MCG/ACT inhaler Inhale 1  puff into the lungs every 4 hours as needed for wheezing.  12/19/16   Carlis Stable, PA-C  Apremilast (OTEZLA PO) Take by mouth.    [provider]  aspirin EC 81 MG tablet Take 1 tablet (81 mg total) by mouth daily. 09/19/16   Carlis Stable, PA-C  atorvastatin (LIPITOR) 10 MG tablet Take 1 tablet (10 mg total) by mouth daily. Due for lab work 11/23/17   Carlis Stable, PA-C  Calcipotriene-Betameth Diprop (ENSTILAR EX) Apply topically.    [provider]  fluticasone (FLONASE) 50 MCG/ACT nasal spray Place 2 sprays into both nostrils daily. 03/14/17   Jannifer Rodney A, FNP  rOPINIRole (REQUIP) 1 MG tablet Take 2 tablets (2 mg total) by mouth at bedtime. 11/14/17   Carlis Stable, PA-C  triamcinolone (NASACORT) 55 MCG/ACT AERO nasal inhaler Place 2 sprays into the nose daily. 11/22/16   Carlis Stable, PA-C    Family History Family History  Problem Relation Age of Onset  . Heart disease Mother   . Drug abuse Mother   . Heart disease Father   . Heart attack Father   . Hypertension Father     Social History Social History   Tobacco Use  . Smoking status: Current Every Day Smoker    Packs/day: 1.00  Years: 22.00    Pack years: 22.00    Types: Cigarettes  . Smokeless tobacco: Never Used  Substance Use Topics  . Alcohol use: No  . Drug use: No     Allergies   Patient has no known allergies.   Review of Systems Review of Systems  Constitutional: Negative for fever.  Musculoskeletal: Negative for arthralgias and myalgias.  Skin: Positive for wound. Negative for color change.     Physical Exam Triage Vital Signs ED Triage Vitals [01/16/18 2004]  Enc Vitals Group     BP (!) 150/90     Pulse Rate 72     Resp 18     Temp 97.9 F (36.6 C)     Temp Source Oral     SpO2 97 %     Weight      Height      Head Circumference      Peak Flow      Pain Score      Pain Loc      Pain Edu?       Excl. in GC?    No data found.  Updated Vital Signs BP (!) 150/90 (BP Location: Right Arm)   Pulse 72   Temp 97.9 F (36.6 C) (Oral)   Resp 18   SpO2 97%   Visual Acuity Right Eye Distance:   Left Eye Distance:   Bilateral Distance:    Right Eye Near:   Left Eye Near:    Bilateral Near:     Physical Exam  Constitutional: He is oriented to person, place, and time. He appears well-developed and well-nourished.  HENT:  Head: Normocephalic and atraumatic.  Eyes: EOM are normal.  Neck: Normal range of motion.  Cardiovascular: Normal rate.  Pulmonary/Chest: Effort normal.  Musculoskeletal: Normal range of motion.  Neurological: He is alert and oriented to person, place, and time.  Skin: Skin is warm and dry.  Left anterior thigh: bandage in placed with dried blood appearing through gauze. No active bleeding visible.  Bandage removed- wound appears to be healing well. No erythema, discharge or active bleeding. Sutures in place.   Psychiatric: He has a normal mood and affect. His behavior is normal.  Nursing note and vitals reviewed.    UC Treatments / Results  Labs (all labs ordered are listed, but only abnormal results are displayed) Labs Reviewed - No data to display  EKG None  Radiology No results found.  Procedures Procedures (including critical care time)  Medications Ordered in UC Medications - No data to display  Initial Impression / Assessment and Plan / UC Course  I have reviewed the triage vital signs and the nursing notes.  Pertinent labs & imaging results that were available during my care of the patient were reviewed by me and considered in my medical decision making (see chart for details).     Bandage removed after warm water and Hibiclens applied to guaze to help remove from wound.  Bandage was removed w/o complication.  Wound pat dried. Bacitracin and new clean dry bandage applied. Pt info packet on sutured wound care provided. Encouraged  to f/u with PCP or Employee Health at [facility] if he dose decide to file worker's comp.  Final Clinical Impressions(s) / UC Diagnoses   Final diagnoses:  Visit for wound check  Dressing change     Discharge Instructions      Keep wound clean with warm water and mild soap. DO NOT soak your wound in  a tub, pool, hot tub, lake, ocean, etc. Pat dry. You may apply antibiotic ointment for 3-5 days, a non-stick bandage. Change bandage 2-3 times daily. Please follow up with family medicine or employee health if needed If concern for infection- increased pain, redness, drainage of pus, fever.     ED Prescriptions    None     Controlled Substance Prescriptions [place] Controlled Substance Registry consulted? Not Applicable   Lurene Shadow, PA-C 01/17/18 1038

## 2018-01-27 ENCOUNTER — Emergency Department (INDEPENDENT_AMBULATORY_CARE_PROVIDER_SITE_OTHER)
Admission: EM | Admit: 2018-01-27 | Discharge: 2018-01-27 | Disposition: A | Discharge status: Home / Self Care | Payer: 59 | Source: Home / Self Care

## 2018-01-27 ENCOUNTER — Encounter: Payer: Self-pay | Admitting: Family Medicine

## 2018-01-27 DIAGNOSIS — Z4802 Encounter for removal of sutures: Secondary | ICD-10-CM

## 2018-01-27 NOTE — ED Provider Notes (Signed)
[facility]    CSN: [id number] Arrival date & time: 01/27/18  1415     History   Chief Complaint Chief Complaint  Patient presents with  . Suture removal    HPI Darryl Vaughn is a 42 y.o. male.   This is a 42 year old man who presents 12 days after a chainsaw accident with a laceration to his left thigh.  This was repaired in the emergency department.  Last tetanus shot was in July of this year.  No drainage, redness, swelling or soreness.     Past Medical History:  Diagnosis Date  . Chronic cough 11/22/2016  . DOE (dyspnea on exertion) 08/24/2016  . Dyslipidemia (high LDL; low HDL) 08/24/2016   40-JW ASCVD risk 9.8%  . Pneumonia of left upper lobe due to infectious organism (HCC) 12/13/2016  . Pneumothorax 1998  . Psoriasis   . Restless leg syndrome 09/19/2016  . Shingles   . Tobacco use disorder 08/22/2016    Patient Active Problem List   Diagnosis Date Noted  . Pneumonia of left upper lobe due to infectious organism (HCC) 12/13/2016  . Chronic cough 11/22/2016  . Rhonchi 11/22/2016  . Non-seasonal allergic rhinitis 11/22/2016  . Restless leg syndrome 09/19/2016  . DOE (dyspnea on exertion) 08/24/2016  . Dyslipidemia (high LDL; low HDL) 08/24/2016  . Tobacco use disorder 08/22/2016  . Family history of heart attack 08/22/2016  . Psoriasis 08/22/2016    Past Surgical History:  Procedure Laterality Date  . CHEST TUBE INSERTION    . MANDIBLE SURGERY    . VASECTOMY    . VASECTOMY         Home Medications    Prior to Admission medications   Medication Sig Start Date End Date Taking? Authorizing Provider  albuterol (PROVENTIL HFA;VENTOLIN HFA) 108 (90 Base) MCG/ACT inhaler Inhale 1 puff into the lungs every 4 hours as needed for wheezing.  12/19/16   Carlis Stable, PA-C  Apremilast (OTEZLA PO) Take by mouth.    [provider]  aspirin EC 81 MG tablet Take 1 tablet (81 mg total) by mouth daily. 09/19/16   Carlis Stable, PA-C  atorvastatin (LIPITOR) 10 MG tablet Take 1 tablet (10 mg total) by mouth daily. Due for lab work 11/23/17   Carlis Stable, PA-C  Calcipotriene-Betameth Diprop (ENSTILAR EX) Apply topically.    [provider]  fluticasone (FLONASE) 50 MCG/ACT nasal spray Place 2 sprays into both nostrils daily. 03/14/17   Jannifer Rodney A, FNP  rOPINIRole (REQUIP) 1 MG tablet Take 2 tablets (2 mg total) by mouth at bedtime. 11/14/17   Carlis Stable, PA-C  triamcinolone (NASACORT) 55 MCG/ACT AERO nasal inhaler Place 2 sprays into the nose daily. 11/22/16   Carlis Stable, PA-C    Family History Family History  Problem Relation Age of Onset  . Heart disease Mother   . Drug abuse Mother   . Heart disease Father   . Heart attack Father   . Hypertension Father     Social History Social History   Tobacco Use  . Smoking status: Current Every Day Smoker    Packs/day: 1.00    Years: 22.00    Pack years: 22.00    Types: Cigarettes  . Smokeless tobacco: Never Used  Substance Use Topics  . Alcohol use: No  . Drug use: No     Allergies   Patient has no known allergies.   Review of Systems Review of Systems   Physical  Exam Triage Vital Signs ED Triage Vitals  Enc Vitals Group     BP      Pulse      Resp      Temp      Temp src      SpO2      Weight      Height      Head Circumference      Peak Flow      Pain Score      Pain Loc      Pain Edu?      Excl. in GC?    No data found.  Updated Vital Signs BP 125/80 (BP Location: Left Arm)   Pulse 89   Temp 98.3 F (36.8 C) (Oral)   Ht 5\' 9"  (1.753 m)   Wt 65.1 kg   SpO2 98%   BMI 21.19 kg/m    Physical Exam  Constitutional: He is oriented to person, place, and time. He appears well-developed and well-nourished.  Eyes: Conjunctivae are normal.  Neck: Normal range of motion. Neck supple.  Pulmonary/Chest: Effort normal.  Musculoskeletal: Normal range of motion.    Neurological: He is alert and oriented to person, place, and time.  Skin: Skin is warm and dry.  Nursing note and vitals reviewed.     5 sutures removed uneventfully.  Wound is well closed with dry eschar and edges are nicely approximated 12 days post repair. UC Treatments / Results  Labs (all labs ordered are listed, but only abnormal results are displayed) Labs Reviewed - No data to display  EKG None  Radiology No results found.  Procedures Procedures (including critical care time)  Medications Ordered in UC Medications - No data to display  Initial Impression / Assessment and Plan / UC Course  I have reviewed the triage vital signs and the nursing notes.  Pertinent labs & imaging results that were available during my care of the patient were reviewed by me and considered in my medical decision making (see chart for details).    Final Clinical Impressions(s) / UC Diagnoses   Final diagnoses:  Visit for suture removal   Discharge Instructions   None    ED Prescriptions    None     Controlled Substance Prescriptions [place] Controlled Substance Registry consulted? Not Applicable   Elvina Sidle, MD 01/27/18 [phone]

## 2018-01-27 NOTE — ED Triage Notes (Signed)
Patient here today for suture removal 12 days ago from a chainsaw accident on left upper thigh.

## 2018-05-22 ENCOUNTER — Other Ambulatory Visit: Payer: Self-pay | Admitting: Physician Assistant

## 2018-05-22 DIAGNOSIS — G2581 Restless legs syndrome: Secondary | ICD-10-CM

## 2018-06-11 ENCOUNTER — Ambulatory Visit (INDEPENDENT_AMBULATORY_CARE_PROVIDER_SITE_OTHER): Payer: BLUE CROSS/BLUE SHIELD | Admitting: Physician Assistant

## 2018-06-11 ENCOUNTER — Encounter: Payer: Self-pay | Admitting: Physician Assistant

## 2018-06-11 VITALS — BP 135/88 | HR 80 | Wt 143.0 lb

## 2018-06-11 DIAGNOSIS — G2581 Restless legs syndrome: Secondary | ICD-10-CM

## 2018-06-11 DIAGNOSIS — E785 Hyperlipidemia, unspecified: Secondary | ICD-10-CM

## 2018-06-11 DIAGNOSIS — M7711 Lateral epicondylitis, right elbow: Secondary | ICD-10-CM | POA: Diagnosis not present

## 2018-06-11 DIAGNOSIS — Z5181 Encounter for therapeutic drug level monitoring: Secondary | ICD-10-CM

## 2018-06-11 DIAGNOSIS — L409 Psoriasis, unspecified: Secondary | ICD-10-CM

## 2018-06-11 DIAGNOSIS — Z1329 Encounter for screening for other suspected endocrine disorder: Secondary | ICD-10-CM

## 2018-06-11 MED ORDER — ROPINIROLE HCL 0.5 MG PO TABS: 0.5000 mg | ORAL_TABLET | Freq: Every day | ORAL | 0 refills | Status: DC

## 2018-06-11 MED ORDER — NAPROXEN 500 MG PO TABS: 500.0000 mg | ORAL_TABLET | Freq: Two times a day (BID) | ORAL | 3 refills | Status: DC

## 2018-06-11 MED ORDER — ROPINIROLE HCL 1 MG PO TABS: 2.0000 mg | ORAL_TABLET | Freq: Every day | ORAL | 0 refills | Status: DC

## 2018-06-11 NOTE — Progress Notes (Signed)
HPI:                                                                Darryl Vaughn is a 43 y.o. male who presents to [facility] Darryl Vaughn: Primary Care Sports Medicine today for right elbow pain  Reports injuring his right elbow on the lateral side for approximately 8-9 months ago. Reports injuring it last summer while lifting ramps, described as a muscle strain. There was no swelling. He feels like his ROM is decreased. Endorses repetitive activity using his right arm (dry walling, house work etc.) No prior surgeries  RLS - currently on Requip 2 mg nightly. Feels like symptoms have been slightly increased the last few months. Attributes this to increased stress and decreased exercise. Has been wearing compression socks at night without much improvement. Still smoking cigarettes daily.   Depression screen [facility] 2/9 06/11/2018 08/22/2016  Decreased Interest 0 0  Down, Depressed, Hopeless 0 0  PHQ - 2 Score 0 0     Past Medical History:  Diagnosis Date  . Chronic cough 11/22/2016  . DOE (dyspnea on exertion) 08/24/2016  . Dyslipidemia (high LDL; low HDL) 08/24/2016   74-MO ASCVD risk 9.8%  . Pneumonia of left upper lobe due to infectious organism (HCC) 12/13/2016  . Pneumothorax 1998  . Psoriasis   . Restless leg syndrome 09/19/2016  . Shingles   . Tobacco use disorder 08/22/2016   Past Surgical History:  Procedure Laterality Date  . CHEST TUBE INSERTION    . MANDIBLE SURGERY    . VASECTOMY    . VASECTOMY     Social History   Tobacco Use  . Smoking status: Current Every Day Smoker    Packs/day: 1.00    Years: 22.00    Pack years: 22.00    Types: Cigarettes  . Smokeless tobacco: Never Used  Substance Use Topics  . Alcohol use: No   family history includes Drug abuse in his mother; Heart attack in his father; Heart disease in his father and mother; Hypertension in his father.    ROS: negative except as noted in the HPI  Medications: Current Outpatient  Medications  Medication Sig Dispense Refill  . Apremilast 30 MG TABS Take 30 mg by mouth 2 (two) times daily.    Marland Kitchen albuterol (PROVENTIL HFA;VENTOLIN HFA) 108 (90 Base) MCG/ACT inhaler Inhale 1 puff into the lungs every 4 hours as needed for wheezing.  8.5 Inhaler 0  . aspirin EC 81 MG tablet Take 1 tablet (81 mg total) by mouth daily. 30 tablet 11  . atorvastatin (LIPITOR) 10 MG tablet Take 1 tablet (10 mg total) by mouth daily. Due for lab work 30 tablet 0  . naproxen (NAPROSYN) 500 MG tablet Take 1 tablet (500 mg total) by mouth 2 (two) times daily with a meal. 60 tablet 3  . rOPINIRole (REQUIP) 0.5 MG tablet Take 1 tablet (0.5 mg total) by mouth at bedtime. Take in addition to 2 mg dose for total dose of 2.5 mg 90 tablet 0  . rOPINIRole (REQUIP) 1 MG tablet Take 2 tablets (2 mg total) by mouth at bedtime. Take in addition to 0.5 mg dose for total dose of 2.5 mg 180 tablet 0   No current facility-administered medications for this  visit.    No Known Allergies     Objective:  BP 135/88   Pulse 80   Wt 143 lb (64.9 kg)   BMI 21.12 kg/m  Gen:  alert, not ill-appearing, no distress, appropriate for age HEENT: head normocephalic without obvious abnormality, conjunctiva and cornea clear, trachea midline Pulm: Normal work of breathing, normal phonation Neuro: alert and oriented x 3, no tremor MSK: extremities atraumatic, normal gait and station Right elbow: atraumatic, no edema, strength intact, tenderness over lateral epicondyle Skin: intact, psoriatic plaque of extensor aspect of right elbow Psych: well-groomed, cooperative, good eye contact, euthymic mood, affect mood-congruent, speech is articulate, and thought processes clear and goal-directed  Lab Results  Component Value Date   CREATININE 0.82 08/22/2016   BUN 11 08/22/2016   NA 137 08/22/2016   K 3.9 08/22/2016   CL 101 08/22/2016   CO2 23 08/22/2016   No results found for: TSH, T3TOTAL, T4TOTAL, THYROIDAB Lab Results   Component Value Date   CHOL 197 08/22/2016   HDL 28 (L) 08/22/2016   TRIG 194 (H) 08/22/2016   CHOLHDL 7.0 (H) 08/22/2016   The 10-year ASCVD risk score Darryl George DC Jr., et al., 2013) is: 10.5%   Values used to calculate the score:     Age: 39 years     Sex: Male     Is Non-Hispanic African American: No     Diabetic: No     Tobacco smoker: Yes     Systolic Blood Pressure: 135 mmHg     Is BP treated: No     HDL Cholesterol: 28 mg/dL     Total Cholesterol: 197 mg/dL   No results found for this or any previous visit (from the past 72 hour(s)). No results found.    Assessment and Plan: 43 y.o. male with   .Darryl Vaughn was seen today for elbow pain.  Diagnoses and all orders for this visit:  Restless leg syndrome -     rOPINIRole (REQUIP) 0.5 MG tablet; Take 1 tablet (0.5 mg total) by mouth at bedtime. Take in addition to 2 mg dose for total dose of 2.5 mg -     rOPINIRole (REQUIP) 1 MG tablet; Take 2 tablets (2 mg total) by mouth at bedtime. Take in addition to 0.5 mg dose for total dose of 2.5 mg -     CBC -     Fe+TIBC+Fer -     TSH + free T4 -     B12 and Folate Panel  Lateral epicondylitis of right elbow -     naproxen (NAPROSYN) 500 MG tablet; Take 1 tablet (500 mg total) by mouth 2 (two) times daily with a meal.  Medication monitoring encounter -     CBC -     COMPLETE METABOLIC PANEL WITH GFR -     Lipid Panel w/reflex Direct LDL -     Fe+TIBC+Fer  Dyslipidemia (high LDL; low HDL) -     Lipid Panel w/reflex Direct LDL  Screening for thyroid disorder -     TSH + free T4  Psoriasis   RLS - checking labs including B12, folate, TSH and iron studies - increasing Requip to 2.5 mg QHS - counseled on general measures including limiting caffeine and exercise - declines smoking cessation  Psoriasis - well controlled on Apremilast - no evidence of psoriatic arthropathy - keep f/u with Dermatology  Dyslipidemia - due for routine lab monitoring - ASCVD risk  10.5%, cont statin  Patient education and  anticipatory guidance given Patient agrees with treatment plan Follow-up in 3 months for RLS or sooner as needed if symptoms worsen or fail to improve  Levonne Hubert PA-C

## 2018-06-11 NOTE — Progress Notes (Signed)
Subjective:    I'm seeing this patient as a consultation for: Gena Fray, PA-C  CC: Right elbow pain  HPI: For 9 months this pleasant 43 year old male has had pain he localizes in the lateral aspect of his right elbow, worse with wrist extension, gripping motions.  Moderate, persistent, no radiation.  He is tried some NSAIDs with good relief, has never worn any bracing, and has not done any rehab exercises.  He is never had injections.  I reviewed the past medical history, family history, social history, surgical history, and allergies today and no changes were needed.  Please see the problem list section below in epic for further details.  Past Medical History: Past Medical History:  Diagnosis Date  . Chronic cough 11/22/2016  . DOE (dyspnea on exertion) 08/24/2016  . Dyslipidemia (high LDL; low HDL) 08/24/2016   25-EN ASCVD risk 9.8%  . Pneumonia of left upper lobe due to infectious organism (HCC) 12/13/2016  . Pneumothorax 1998  . Psoriasis   . Restless leg syndrome 09/19/2016  . Shingles   . Tobacco use disorder 08/22/2016   Past Surgical History: Past Surgical History:  Procedure Laterality Date  . CHEST TUBE INSERTION    . MANDIBLE SURGERY    . VASECTOMY    . VASECTOMY     Social History: Social History   Socioeconomic History  . Marital status: Married    Spouse name: Not on file  . Number of children: Not on file  . Years of education: Not on file  . Highest education level: Not on file  Occupational History  . Not on file  Social Needs  . Financial resource strain: Not on file  . Food insecurity:    Worry: Not on file    Inability: Not on file  . Transportation needs:    Medical: Not on file    Non-medical: Not on file  Tobacco Use  . Smoking status: Current Every Day Smoker    Packs/day: 1.00    Years: 22.00    Pack years: 22.00    Types: Cigarettes  . Smokeless tobacco: Never Used  Substance and Sexual Activity  . Alcohol use: No  . Drug use:  No  . Sexual activity: Yes  Lifestyle  . Physical activity:    Days per week: Not on file    Minutes per session: Not on file  . Stress: Not on file  Relationships  . Social connections:    Talks on phone: Not on file    Gets together: Not on file    Attends religious service: Not on file    Active member of club or organization: Not on file    Attends meetings of clubs or organizations: Not on file    Relationship status: Not on file  Other Topics Concern  . Not on file  Social History Narrative  . Not on file   Family History: Family History  Problem Relation Age of Onset  . Heart disease Mother   . Drug abuse Mother   . Heart disease Father   . Heart attack Father   . Hypertension Father    Allergies: No Known Allergies Medications: See med rec.  Review of Systems: No headache, visual changes, nausea, vomiting, diarrhea, constipation, dizziness, abdominal pain, skin rash, fevers, chills, night sweats, weight loss, swollen lymph nodes, body aches, joint swelling, muscle aches, chest pain, shortness of breath, mood changes, visual or auditory hallucinations.   Objective:   General: Well Developed, well nourished,  and in no acute distress.  Neuro:  Extra-ocular muscles intact, able to move all 4 extremities, sensation grossly intact.  Deep tendon reflexes tested were normal. Psych: Alert and oriented, mood congruent with affect. ENT:  Ears and nose appear unremarkable.  Hearing grossly normal. Neck: Unremarkable overall appearance, trachea midline.  No visible thyroid enlargement. Eyes: Conjunctivae and lids appear unremarkable.  Pupils equal and round. Skin: Warm and dry, no rashes noted.  Cardiovascular: Pulses palpable, no extremity edema. Right elbow: Unremarkable to inspection. Range of motion full pronation, supination, flexion, extension. Strength is full to all of the above directions Stable to varus, valgus stress. Negative moving valgus stress test. Tender  to palpation of the common extensor tendon origin Ulnar nerve does not sublux. Negative cubital tunnel Tinel's.  Impression and Recommendations:   This case required medical decision making of moderate complexity.  Lateral epicondylitis of right elbow 9 months of symptoms we are going to start conservatively. Adding naproxen 500 twice daily per his request, counterforce brace, rehab exercises. Return to see me in 4 to 6 weeks, injection if no better. ___________________________________________ Ihor Austin. Benjamin Stain, M.D., ABFM., CAQSM. Primary Care and Sports Medicine [facility]  Adjunct Professor of Family Medicine  [employer]

## 2018-06-11 NOTE — Assessment & Plan Note (Signed)
9 months of symptoms we are going to start conservatively. Adding naproxen 500 twice daily per his request, counterforce brace, rehab exercises. Return to see me in 4 to 6 weeks, injection if no better.

## 2018-06-11 NOTE — Patient Instructions (Signed)

## 2018-06-13 DIAGNOSIS — E785 Hyperlipidemia, unspecified: Secondary | ICD-10-CM | POA: Diagnosis not present

## 2018-06-13 DIAGNOSIS — Z5181 Encounter for therapeutic drug level monitoring: Secondary | ICD-10-CM | POA: Diagnosis not present

## 2018-06-13 DIAGNOSIS — Z1329 Encounter for screening for other suspected endocrine disorder: Secondary | ICD-10-CM | POA: Diagnosis not present

## 2018-06-13 DIAGNOSIS — G2581 Restless legs syndrome: Secondary | ICD-10-CM | POA: Diagnosis not present

## 2018-06-14 LAB — COMPLETE METABOLIC PANEL WITH GFR
AG Ratio: 1.7 (calc) (ref 1.0–2.5)
ALT: 14 U/L (ref 9–46)
AST: 15 U/L (ref 10–40)
Albumin: 4.3 g/dL (ref 3.6–5.1)
Alkaline phosphatase (APISO): 63 U/L (ref 36–130)
BUN: 18 mg/dL (ref 7–25)
CO2: 26 mmol/L (ref 20–32)
Calcium: 9.5 mg/dL (ref 8.6–10.3)
Chloride: 105 mmol/L (ref 98–110)
Creat: 0.84 mg/dL (ref 0.60–1.35)
GFR, EST AFRICAN AMERICAN: 124 mL/min/{1.73_m2} (ref >=60)
GFR, Est Non African American: 107 mL/min/{1.73_m2} (ref >=60)
Globulin: 2.6 g/dL (calc) (ref 1.9–3.7)
Glucose, Bld: 87 mg/dL (ref 65–99)
Potassium: 4.1 mmol/L (ref 3.5–5.3)
Sodium: 139 mmol/L (ref 135–146)
TOTAL PROTEIN: 6.9 g/dL (ref 6.1–8.1)
Total Bilirubin: 0.3 mg/dL (ref 0.2–1.2)

## 2018-06-14 LAB — IRON,TIBC AND FERRITIN PANEL
%SAT: 17 % (calc) — ABNORMAL LOW (ref 20–48)
Ferritin: 60 ng/mL (ref 38–380)
IRON: 58 ug/dL (ref 50–180)
TIBC: 339 mcg/dL (calc) (ref 250–425)

## 2018-06-14 LAB — CBC
HCT: 43.4 % (ref 38.5–50.0)
Hemoglobin: 15 g/dL (ref 13.2–17.1)
MCH: 30.7 pg (ref 27.0–33.0)
MCHC: 34.6 g/dL (ref 32.0–36.0)
MCV: 88.9 fL (ref 80.0–100.0)
MPV: 9.3 fL (ref 7.5–12.5)
Platelets: 289 10*3/uL (ref 140–400)
RBC: 4.88 10*6/uL (ref 4.20–5.80)
RDW: 12.8 % (ref 11.0–15.0)
WBC: 10.1 10*3/uL (ref 3.8–10.8)

## 2018-06-14 LAB — LIPID PANEL W/REFLEX DIRECT LDL
Cholesterol: 183 mg/dL (ref <200)
HDL: 36 mg/dL — ABNORMAL LOW (ref >=40)
LDL Cholesterol (Calc): 124 mg/dL (calc) — ABNORMAL HIGH
Non-HDL Cholesterol (Calc): 147 mg/dL (calc) — ABNORMAL HIGH (ref <130)
Total CHOL/HDL Ratio: 5.1 (calc) — ABNORMAL HIGH (ref <5.0)
Triglycerides: 115 mg/dL (ref <150)

## 2018-06-14 LAB — TSH+FREE T4: TSH W/REFLEX TO FT4: 1.85 m[IU]/L (ref 0.40–4.50)

## 2018-06-14 LAB — B12 AND FOLATE PANEL
Folate: 13.7 ng/mL
VITAMIN B 12: 477 pg/mL (ref 200–1100)

## 2018-06-21 ENCOUNTER — Other Ambulatory Visit: Payer: Self-pay | Admitting: Physician Assistant

## 2018-06-21 DIAGNOSIS — G2581 Restless legs syndrome: Secondary | ICD-10-CM

## 2018-08-24 ENCOUNTER — Other Ambulatory Visit: Payer: Self-pay | Admitting: Physician Assistant

## 2018-08-24 DIAGNOSIS — G2581 Restless legs syndrome: Secondary | ICD-10-CM

## 2018-09-06 ENCOUNTER — Other Ambulatory Visit: Payer: Self-pay | Admitting: Physician Assistant

## 2018-09-06 DIAGNOSIS — G2581 Restless legs syndrome: Secondary | ICD-10-CM

## 2018-10-02 DIAGNOSIS — L400 Psoriasis vulgaris: Secondary | ICD-10-CM | POA: Diagnosis not present

## 2018-10-02 DIAGNOSIS — L819 Disorder of pigmentation, unspecified: Secondary | ICD-10-CM | POA: Diagnosis not present

## 2018-10-02 DIAGNOSIS — L814 Other melanin hyperpigmentation: Secondary | ICD-10-CM | POA: Diagnosis not present

## 2018-10-02 DIAGNOSIS — L579 Skin changes due to chronic exposure to nonionizing radiation, unspecified: Secondary | ICD-10-CM | POA: Diagnosis not present

## 2018-10-03 ENCOUNTER — Other Ambulatory Visit: Payer: Self-pay | Admitting: Sports Medicine

## 2018-10-03 DIAGNOSIS — M7711 Lateral epicondylitis, right elbow: Secondary | ICD-10-CM

## 2018-10-14 DIAGNOSIS — H16293 Other keratoconjunctivitis, bilateral: Secondary | ICD-10-CM | POA: Diagnosis not present

## 2018-10-17 DIAGNOSIS — H16293 Other keratoconjunctivitis, bilateral: Secondary | ICD-10-CM | POA: Diagnosis not present

## 2018-11-15 ENCOUNTER — Encounter: Payer: Self-pay | Admitting: Physician Assistant

## 2018-11-15 DIAGNOSIS — Z209 Contact with and (suspected) exposure to unspecified communicable disease: Secondary | ICD-10-CM

## 2018-11-20 DIAGNOSIS — Z209 Contact with and (suspected) exposure to unspecified communicable disease: Secondary | ICD-10-CM | POA: Diagnosis not present

## 2018-11-21 LAB — SAR COV2 SEROLOGY (COVID19)AB(IGG),IA: SARS CoV2 AB IGG: NEGATIVE

## 2018-11-22 ENCOUNTER — Other Ambulatory Visit: Payer: Self-pay | Admitting: Physician Assistant

## 2018-11-22 DIAGNOSIS — G2581 Restless legs syndrome: Secondary | ICD-10-CM

## 2018-12-09 ENCOUNTER — Other Ambulatory Visit: Payer: Self-pay | Admitting: Physician Assistant

## 2018-12-09 DIAGNOSIS — G2581 Restless legs syndrome: Secondary | ICD-10-CM

## 2019-02-03 ENCOUNTER — Other Ambulatory Visit: Payer: Self-pay | Admitting: Sports Medicine

## 2019-02-03 DIAGNOSIS — M7711 Lateral epicondylitis, right elbow: Secondary | ICD-10-CM

## 2019-02-03 MED ORDER — NAPROXEN 500 MG PO TABS: 500.0000 mg | ORAL_TABLET | Freq: Two times a day (BID) | ORAL | 3 refills | Status: AC

## 2019-02-12 ENCOUNTER — Other Ambulatory Visit: Payer: Self-pay

## 2019-02-12 ENCOUNTER — Emergency Department (INDEPENDENT_AMBULATORY_CARE_PROVIDER_SITE_OTHER)
Admission: EM | Admit: 2019-02-12 | Discharge: 2019-02-12 | Disposition: A | Discharge status: Home / Self Care | Payer: BC Managed Care – PPO | Source: Home / Self Care

## 2019-02-12 DIAGNOSIS — L03012 Cellulitis of left finger: Secondary | ICD-10-CM

## 2019-02-12 MED ORDER — DOXYCYCLINE HYCLATE 100 MG PO CAPS: 100.0000 mg | ORAL_CAPSULE | Freq: Two times a day (BID) | ORAL | 0 refills | Status: DC

## 2019-02-12 MED ORDER — MUPIROCIN 2 % EX OINT: TOPICAL_OINTMENT | CUTANEOUS | 0 refills | Status: DC

## 2019-02-12 NOTE — Discharge Instructions (Signed)
°  Keep wound clean with warm water and soap. You may also soak your finger in warm water and Epson salt 2-3 times daily for 15-20 minutes at a time.   Please take antibiotics as prescribed and be sure to complete entire course even if you start to feel better to ensure infection does not come back.  Please follow up in 2-3 days if not improving, sooner if worsening before your trip.

## 2019-02-12 NOTE — ED Triage Notes (Signed)
Pt noticed pain in the left ring finger over the weekend, and has progressively become worse.  Redness and swelling noted around nail

## 2019-02-12 NOTE — ED Provider Notes (Signed)
[name]    CSN: [id number] Arrival date & time: 02/12/19  1632      History   Chief Complaint Chief Complaint  Patient presents with  . Hand Pain    HPI Darryl Vaughn is a 43 y.o. male.   HPI Darryl Vaughn is a 43 y.o. male presenting to UC with c/o 3 days of gradually worsening pain, redness, and swelling to Left ring finger that is c/w a prior finger infection of same finger in 2018 that needed to be I&D.  He had a wound culture at that time that showed staph aureus per medical records.  He leaves for [place] this weekend and wants it treated before then.     Past Medical History:  Diagnosis Date  . Chronic cough 11/22/2016  . DOE (dyspnea on exertion) 08/24/2016  . Dyslipidemia (high LDL; low HDL) 08/24/2016   10-yr ASCVD risk 9.8%  . Pneumonia of left upper lobe due to infectious organism 12/13/2016  . Pneumothorax 1998  . Psoriasis   . Restless leg syndrome 09/19/2016  . Shingles   . Tobacco use disorder 08/22/2016    Patient Active Problem List   Diagnosis Date Noted  . Lateral epicondylitis of right elbow 06/11/2018  . Pneumonia of left upper lobe due to infectious organism 12/13/2016  . Chronic cough 11/22/2016  . Rhonchi 11/22/2016  . Non-seasonal allergic rhinitis 11/22/2016  . Restless leg syndrome 09/19/2016  . DOE (dyspnea on exertion) 08/24/2016  . Dyslipidemia (high LDL; low HDL) 08/24/2016  . Tobacco use disorder 08/22/2016  . Family history of heart attack 08/22/2016  . Psoriasis 08/22/2016    Past Surgical History:  Procedure Laterality Date  . CHEST TUBE INSERTION    . MANDIBLE SURGERY    . VASECTOMY    . VASECTOMY         Home Medications    Prior to Admission medications   Medication Sig Start Date End Date Taking? Authorizing Provider  albuterol (PROVENTIL HFA;VENTOLIN HFA) 108 (90 Base) MCG/ACT inhaler Inhale 1 puff into the lungs every 4 hours as needed for wheezing.  12/19/16   Trixie Dredge, PA-C   Apremilast 30 MG TABS Take 30 mg by mouth 2 (two) times daily.    [provider]  aspirin EC 81 MG tablet Take 1 tablet (81 mg total) by mouth daily. 09/19/16   Trixie Dredge, PA-C  atorvastatin (LIPITOR) 10 MG tablet Take 1 tablet (10 mg total) by mouth daily. Due for lab work 11/23/17   Trixie Dredge, PA-C  doxycycline (VIBRAMYCIN) 100 MG capsule Take 1 capsule (100 mg total) by mouth 2 (two) times daily. One po bid x 7 days 02/12/19   Noe Gens, PA-C  mupirocin ointment Drue Stager) 2 % Apply to wound 3 times daily for 5 days 02/12/19   Noe Gens, PA-C  naproxen (NAPROSYN) 500 MG tablet Take 1 tablet (500 mg total) by mouth 2 (two) times daily with a meal. 02/03/19   Silverio Decamp, MD  rOPINIRole (REQUIP) 0.5 MG tablet TAKE 1 TABLET BY MOUTH AT BEDTIME. TAKE IN ADDITION TO 2 MG DOSE FOR TOTAL DOSE OF 2.5 MG 11/22/18   Trixie Dredge, PA-C  rOPINIRole (REQUIP) 1 MG tablet TAKE 2 TABLETS BY MOUTH AT BEDTIME. TAKE IN ADDITION TO 0.5 MG DOSE FOR TOTAL DOSE OF 2.5 MG 12/09/18   Trixie Dredge, PA-C    Family History Family History  Problem Relation Age of Onset  . Heart  disease Mother   . Drug abuse Mother   . Heart disease Father   . Heart attack Father   . Hypertension Father     Social History Social History   Tobacco Use  . Smoking status: Current Every Day Smoker    Packs/day: 1.00    Years: 22.00    Pack years: 22.00    Types: Cigarettes  . Smokeless tobacco: Never Used  Substance Use Topics  . Alcohol use: No  . Drug use: No     Allergies   Patient has no known allergies.   Review of Systems Review of Systems  Constitutional: Negative for chills and fever.  Musculoskeletal: Negative for arthralgias.  Skin: Positive for color change. Negative for wound.     Physical Exam Triage Vital Signs ED Triage Vitals [02/12/19 1651]  Enc Vitals Group     BP 137/82     Pulse Rate 87     Resp 20      Temp 98.2 F (36.8 C)     Temp Source Oral     SpO2 99 %     Weight 138 lb (62.6 kg)     Height 5\' 9"  (1.753 m)     Head Circumference      Peak Flow      Pain Score 5     Pain Loc      Pain Edu?      Excl. in GC?    No data found.  Updated Vital Signs BP 137/82 (BP Location: Right Arm)   Pulse 87   Temp 98.2 F (36.8 C) (Oral)   Resp 20   Ht 5\' 9"  (1.753 m)   Wt 138 lb (62.6 kg)   SpO2 99%   BMI 20.38 kg/m   Visual Acuity Right Eye Distance:   Left Eye Distance:   Bilateral Distance:    Right Eye Near:   Left Eye Near:    Bilateral Near:     Physical Exam Vitals signs and nursing note reviewed.  Constitutional:      Appearance: He is well-developed.  HENT:     Head: Normocephalic and atraumatic.  Neck:     Musculoskeletal: Normal range of motion.  Cardiovascular:     Rate and Rhythm: Normal rate.  Pulmonary:     Effort: Pulmonary effort is normal.  Musculoskeletal: Normal range of motion.  Skin:    General: Skin is warm and dry.     Capillary Refill: Capillary refill takes less than 2 seconds.     Findings: Erythema present.     Comments: Left ring finger, dorsal aspect: erythema, edema, tenderness and fluctuance at base of nailbed. No active drainage.   Neurological:     Mental Status: He is alert and oriented to person, place, and time.  Psychiatric:        Behavior: Behavior normal.      UC Treatments / Results  Labs (all labs ordered are listed, but only abnormal results are displayed) Labs Reviewed - No data to display  EKG   Radiology No results found.  Procedures Incision and Drainage  Date/Time: 02/12/2019 6:03 PM Performed by: , PA-C Authorized by: 13/07/2018, PA-C   Consent:    Consent obtained:  Verbal   Consent given by:  Patient   Risks discussed:  Bleeding, pain, incomplete drainage and infection   Alternatives discussed:  No treatment and delayed treatment Location:    Type:  Abscess   Size:  1  Location:  Upper extremity   Upper extremity location:  Finger   Finger location:  L ring finger Pre-procedure details:    Skin preparation:  Betadine Anesthesia (see MAR for exact dosages):    Anesthesia method:  Topical application   Topical anesthesia: Pain eaze spray. Procedure type:    Complexity:  Simple Procedure details:    Needle aspiration: no     Incision types:  Stab incision   Incision depth:  Subcutaneous   Scalpel size: 18gtt needle.   Drainage:  Bloody and purulent   Drainage amount:  Moderate   Wound treatment:  Wound left open   Packing materials:  None Post-procedure details:    Patient tolerance of procedure:  Tolerated well, no immediate complications   (including critical care time)  Medications Ordered in UC Medications - No data to display  Initial Impression / Assessment and Plan / UC Course  I have reviewed the triage vital signs and the nursing notes.  Pertinent labs & imaging results that were available during my care of the patient were reviewed by me and considered in my medical decision making (see chart for details).     Paronychia of Left ring finger I&D as noted above Will start on antibiotics AVS provided  Final Clinical Impressions(s) / UC Diagnoses   Final diagnoses:  Paronychia of left ring finger     Discharge Instructions      Keep wound clean with warm water and soap. You may also soak your finger in warm water and Epson salt 2-3 times daily for 15-20 minutes at a time.   Please take antibiotics as prescribed and be sure to complete entire course even if you start to feel better to ensure infection does not come back.  Please follow up in 2-3 days if not improving, sooner if worsening before your trip.    ED Prescriptions    Medication Sig Dispense Auth. Provider   doxycycline (VIBRAMYCIN) 100 MG capsule Take 1 capsule (100 mg total) by mouth 2 (two) times daily. One po bid x 7 days 14 capsule Doroteo GlassmanPhelps, Brittini Brubeck O, PA-C    mupirocin ointment (BACTROBAN) 2 % Apply to wound 3 times daily for 5 days 22 g Lurene ShadowPhelps, Brilyn Tuller O, [place]PA-C     PDMP not reviewed this encounter.   Lurene Shadowhelps, Lynsey Ange O, [place]PA-C 02/12/19 [id number]

## 2019-02-16 ENCOUNTER — Other Ambulatory Visit: Payer: Self-pay | Admitting: Physician Assistant

## 2019-02-16 DIAGNOSIS — G2581 Restless legs syndrome: Secondary | ICD-10-CM

## 2019-03-03 ENCOUNTER — Other Ambulatory Visit: Payer: Self-pay | Admitting: Family Medicine

## 2019-03-03 DIAGNOSIS — G2581 Restless legs syndrome: Secondary | ICD-10-CM

## 2019-03-11 ENCOUNTER — Other Ambulatory Visit: Payer: Self-pay | Admitting: Physician Assistant

## 2019-03-11 DIAGNOSIS — G2581 Restless legs syndrome: Secondary | ICD-10-CM

## 2019-05-26 ENCOUNTER — Other Ambulatory Visit: Payer: Self-pay | Admitting: Physician Assistant

## 2019-05-26 DIAGNOSIS — G2581 Restless legs syndrome: Secondary | ICD-10-CM

## 2019-06-09 ENCOUNTER — Ambulatory Visit (INDEPENDENT_AMBULATORY_CARE_PROVIDER_SITE_OTHER): Payer: BC Managed Care – PPO | Admitting: Medical-Surgical

## 2019-06-09 DIAGNOSIS — Z5329 Procedure and treatment not carried out because of patient's decision for other reasons: Secondary | ICD-10-CM

## 2019-06-09 NOTE — Progress Notes (Deleted)
Subjective:    CC: follow up for RLS  HPI: 44 year old male resenting for follow up on chronic conditions below. Has been seeking most care using emergency services as needed for the past several years. Due for labs.  RLS:  Tobacco use:  Hyperlipidemia:  I reviewed the past medical history, family history, social history, surgical history, and allergies today and no changes were needed.  Please see the problem list section below in epic for further details.  Past Medical History: Past Medical History:  Diagnosis Date  . Chronic cough 11/22/2016  . DOE (dyspnea on exertion) 08/24/2016  . Dyslipidemia (high LDL; low HDL) 08/24/2016   10-yr ASCVD risk 9.8%  . Pneumonia of left upper lobe due to infectious organism 12/13/2016  . Pneumothorax 1998  . Psoriasis   . Restless leg syndrome 09/19/2016  . Shingles   . Tobacco use disorder 08/22/2016   Past Surgical History: Past Surgical History:  Procedure Laterality Date  . CHEST TUBE INSERTION    . MANDIBLE SURGERY    . VASECTOMY    . VASECTOMY     Social History: Social History   Socioeconomic History  . Marital status: Married    Spouse name: Not on file  . Number of children: Not on file  . Years of education: Not on file  . Highest education level: Not on file  Occupational History  . Not on file  Tobacco Use  . Smoking status: Current Every Day Smoker    Packs/day: 1.00    Years: 22.00    Pack years: 22.00    Types: Cigarettes  . Smokeless tobacco: Never Used  Substance and Sexual Activity  . Alcohol use: No  . Drug use: No  . Sexual activity: Yes  Other Topics Concern  . Not on file  Social History Narrative  . Not on file   Social Determinants of Health   Financial Resource Strain:   . Difficulty of Paying Living Expenses: Not on file  Food Insecurity:   . Worried About Charity fundraiser in the Last Year: Not on file  . Ran Out of Food in the Last Year: Not on file  Transportation Needs:   . Lack of  Transportation (Medical): Not on file  . Lack of Transportation (Non-Medical): Not on file  Physical Activity:   . Days of Exercise per Week: Not on file  . Minutes of Exercise per Session: Not on file  Stress:   . Feeling of Stress : Not on file  Social Connections:   . Frequency of Communication with Friends and Family: Not on file  . Frequency of Social Gatherings with Friends and Family: Not on file  . Attends Religious Services: Not on file  . Active Member of Clubs or Organizations: Not on file  . Attends Archivist Meetings: Not on file  . Marital Status: Not on file   Family History: Family History  Problem Relation Age of Onset  . Heart disease Mother   . Drug abuse Mother   . Heart disease Father   . Heart attack Father   . Hypertension Father    Allergies: No Known Allergies Medications: See med rec.  Review of Systems: No fevers, chills, night sweats, weight loss, chest pain, or shortness of breath.   Objective:    General: Well Developed, well nourished, and in no acute distress.  Neuro: Alert and oriented x3, extra-ocular muscles intact, sensation grossly intact.  HEENT: Normocephalic, atraumatic, pupils equal round reactive  to light, neck supple, no masses, no lymphadenopathy, thyroid nonpalpable.  Skin: Warm and dry, no rashes. Cardiac: Regular rate and rhythm, no murmurs rubs or gallops, no lower extremity edema.  Respiratory: Clear to auscultation bilaterally. Not using accessory muscles, speaking in full sentences.   Impression and Recommendations:    No problem-specific Assessment & Plan notes found for this encounter.   ___________________________________________ Thayer Ohm, DNP, APRN, FNP-BC Primary Care and Sports Medicine [facility]

## 2019-06-16 ENCOUNTER — Other Ambulatory Visit: Payer: Self-pay

## 2019-06-16 ENCOUNTER — Encounter: Payer: Self-pay | Admitting: Medical-Surgical

## 2019-06-16 ENCOUNTER — Ambulatory Visit (INDEPENDENT_AMBULATORY_CARE_PROVIDER_SITE_OTHER): Payer: BC Managed Care – PPO | Admitting: Medical-Surgical

## 2019-06-16 VITALS — BP 138/90 | HR 82 | Temp 98.0°F | Ht 68.75 in | Wt 146.8 lb

## 2019-06-16 DIAGNOSIS — G2581 Restless legs syndrome: Secondary | ICD-10-CM | POA: Diagnosis not present

## 2019-06-16 DIAGNOSIS — Z0000 Encounter for general adult medical examination without abnormal findings: Secondary | ICD-10-CM | POA: Diagnosis not present

## 2019-06-16 DIAGNOSIS — F17200 Nicotine dependence, unspecified, uncomplicated: Secondary | ICD-10-CM

## 2019-06-16 DIAGNOSIS — L409 Psoriasis, unspecified: Secondary | ICD-10-CM | POA: Diagnosis not present

## 2019-06-16 MED ORDER — ROPINIROLE HCL 3 MG PO TABS: 3.0000 mg | ORAL_TABLET | Freq: Every day | ORAL | 1 refills | Status: DC

## 2019-06-16 NOTE — Progress Notes (Signed)
Subjective:    CC: Restless leg syndrome follow-up  HPI: Pleasant 44 year old male presenting today to follow-up for restless leg syndrome.  Reports bilateral lower extremity restlessness with pain occurring during the day and at night, worse with late carb-filled dinners. He has been taking ropinirole 2 to 3 mg nightly.  Was taking 2.5mg  regularly and then ran out of the half milligram tablets.  2 mg is not helpful, and 2.5 mg is somewhat helpful.  He reports taking 3 mg at night significantly reduces his symptoms and he is able to sleep.  Tolerating ropinirole well without any side effects.  Tobacco use-continues to smoke 1 pack/day of cigarettes.  Has previously tried quitting using Chantix but had significant side effects.  Reports he is not ready to quit at this time as he likes the act of smoking.  Psoriasis-sees dermatology for management of this.  Has been taking [place] but needs a new appointment to get refills.  Reports psoriasis is worse in the winter, better in the summer.  I reviewed the past medical history, family history, social history, surgical history, and allergies today and no changes were needed.  Please see the problem list section below in epic for further details.  Past Medical History: Past Medical History:  Diagnosis Date  . Chronic cough 11/22/2016  . DOE (dyspnea on exertion) 08/24/2016  . Dyslipidemia (high LDL; low HDL) 08/24/2016   32-GM ASCVD risk 9.8%  . Pneumonia of left upper lobe due to infectious organism 12/13/2016  . Pneumothorax 1998  . Psoriasis   . Restless leg syndrome 09/19/2016  . Shingles   . Tobacco use disorder 08/22/2016   Past Surgical History: Past Surgical History:  Procedure Laterality Date  . CHEST TUBE INSERTION    . MANDIBLE SURGERY    . VASECTOMY    . VASECTOMY     Social History: Social History   Socioeconomic History  . Marital status: Married    Spouse name: Not on file  . Number of children: Not on file  . Years of  education: Not on file  . Highest education level: Not on file  Occupational History  . Occupation: Buyer, retail: [employer]   Tobacco Use  . Smoking status: Current Every Day Smoker    Packs/day: 1.00    Years: 22.00    Pack years: 22.00    Types: Cigarettes  . Smokeless tobacco: Never Used  Substance and Sexual Activity  . Alcohol use: No    Comment: 2-3 drinks/month, mostly in summertime  . Drug use: No  . Sexual activity: Yes    Birth control/protection: Surgical    Comment: vasectomy  Other Topics Concern  . Not on file  Social History Narrative  . Not on file   Social Determinants of Health   Financial Resource Strain:   . Difficulty of Paying Living Expenses: Not on file  Food Insecurity:   . Worried About Programme researcher, broadcasting/film/video in the Last Year: Not on file  . Ran Out of Food in the Last Year: Not on file  Transportation Needs:   . Lack of Transportation (Medical): Not on file  . Lack of Transportation (Non-Medical): Not on file  Physical Activity:   . Days of Exercise per Week: Not on file  . Minutes of Exercise per Session: Not on file  Stress:   . Feeling of Stress : Not on file  Social Connections:   . Frequency of Communication with  Friends and Family: Not on file  . Frequency of Social Gatherings with Friends and Family: Not on file  . Attends Religious Services: Not on file  . Active Member of Clubs or Organizations: Not on file  . Attends Archivist Meetings: Not on file  . Marital Status: Not on file   Family History: Family History  Problem Relation Age of Onset  . Heart disease Mother   . Drug abuse Mother   . Heart disease Father   . Heart attack Father   . Hypertension Father    Allergies: No Known Allergies Medications: See med rec.  Review of Systems: No fevers, chills, night sweats, weight loss, chest pain, or shortness of breath.   Objective:    General: Well Developed, well nourished,  and in no acute distress.  Neuro: Alert and oriented x3.  HEENT: Normocephalic, atraumatic.  Skin: Warm and dry. Cardiac: Regular rate and rhythm, no murmurs rubs or gallops, no lower extremity edema.  Respiratory: Clear to auscultation bilaterally. Not using accessory muscles, speaking in full sentences.   Impression and Recommendations:    Restless leg syndrome Increase ropinirole to 3 mg nightly.  Avoid foods and activities that exacerbate symptoms.  Psoriasis Managed by dermatology.  Tobacco use Discussed smoking cessation.  Patient open to information but not willing to quit at this time.  Advised that there are various medications that can be used to help when he is ready.  Preventative health care Checking CBC, CMP, and lipids today.  Will return in 6 months for his annual physical exam.  Return in about 6 months (around 12/17/2019) for annual physical exam. ___________________________________________ Clearnce Sorrel, DNP, APRN, FNP-BC Primary Care and [place]

## 2019-11-22 ENCOUNTER — Other Ambulatory Visit: Payer: Self-pay | Admitting: Medical-Surgical

## 2019-11-22 DIAGNOSIS — G2581 Restless legs syndrome: Secondary | ICD-10-CM

## 2019-12-25 ENCOUNTER — Other Ambulatory Visit: Payer: Self-pay | Admitting: Medical-Surgical

## 2019-12-25 DIAGNOSIS — G2581 Restless legs syndrome: Secondary | ICD-10-CM

## 2020-02-08 ENCOUNTER — Other Ambulatory Visit: Payer: Self-pay | Admitting: Medical-Surgical

## 2020-02-08 DIAGNOSIS — G2581 Restless legs syndrome: Secondary | ICD-10-CM

## 2020-02-24 ENCOUNTER — Other Ambulatory Visit: Payer: Self-pay | Admitting: Medical-Surgical

## 2020-02-24 DIAGNOSIS — G2581 Restless legs syndrome: Secondary | ICD-10-CM

## 2020-03-02 ENCOUNTER — Encounter: Payer: Self-pay | Admitting: Medical-Surgical

## 2020-03-02 DIAGNOSIS — G2581 Restless legs syndrome: Secondary | ICD-10-CM

## 2020-03-02 MED ORDER — ROPINIROLE HCL 3 MG PO TABS: 3.0000 mg | ORAL_TABLET | Freq: Every day | ORAL | 0 refills | Status: DC

## 2020-03-02 NOTE — Telephone Encounter (Signed)
LVM for patient to call back to get this appt scheduled with Joy. AM

## 2020-03-02 NOTE — Telephone Encounter (Signed)
Darryl Vaughn has scheduled appt. AM

## 2020-03-03 ENCOUNTER — Other Ambulatory Visit: Payer: Self-pay | Admitting: Medical-Surgical

## 2020-03-03 ENCOUNTER — Encounter: Payer: Self-pay | Admitting: Medical-Surgical

## 2020-03-03 ENCOUNTER — Ambulatory Visit (INDEPENDENT_AMBULATORY_CARE_PROVIDER_SITE_OTHER): Payer: BC Managed Care – PPO | Admitting: Medical-Surgical

## 2020-03-03 VITALS — BP 138/79 | HR 78 | Temp 98.5°F | Ht 68.75 in | Wt 154.5 lb

## 2020-03-03 DIAGNOSIS — G2581 Restless legs syndrome: Secondary | ICD-10-CM

## 2020-03-03 DIAGNOSIS — Z0000 Encounter for general adult medical examination without abnormal findings: Secondary | ICD-10-CM

## 2020-03-03 MED ORDER — GABAPENTIN 100 MG PO CAPS: 200.0000 mg | ORAL_CAPSULE | Freq: Three times a day (TID) | ORAL | 0 refills | Status: DC

## 2020-03-03 NOTE — Progress Notes (Signed)
Subjective:    CC: Restless leg syndrome  HPI: Pleasant 44 year old male presenting today for a follow-up on restless leg syndrome. He has been taking ropinirole for several years and recently required a dose increase. Notes that he does still have some breakthrough restless leg syndrome symptoms on most nights. He has developed a system of squatting down outside to force his legs to "go to sleep" and/or cramp up. After this, he is able to go to bed and fell asleep without difficulty. For the last couple of months he has noted some new symptoms surrounding his ropinirole administration. Approximately 20-30 minutes after taking the medication at night, he experiences dizziness, nausea, and a hot sensation when he stands up. Once he is lying down, his symptoms spontaneously resolved and did not occur again until the next time he takes the ropinirole. He does not have a blood pressure cuff at home so has not been able to check and see if his blood pressure drops when he experiences the symptoms. Denies fever, chills, shortness of breath, chest pain, dizziness, headaches, palpitations, leg swelling, coughing, and GI symptoms.  I reviewed the past medical history, family history, social history, surgical history, and allergies today and no changes were needed.  Please see the problem list section below in epic for further details.  Past Medical History: Past Medical History:  Diagnosis Date  . Chronic cough 11/22/2016  . DOE (dyspnea on exertion) 08/24/2016  . Dyslipidemia (high LDL; low HDL) 08/24/2016   77-LT ASCVD risk 9.8%  . Pneumonia of left upper lobe due to infectious organism 12/13/2016  . Pneumothorax 1998  . Psoriasis   . Restless leg syndrome 09/19/2016  . Shingles   . Tobacco use disorder 08/22/2016   Past Surgical History: Past Surgical History:  Procedure Laterality Date  . CHEST TUBE INSERTION    . MANDIBLE SURGERY    . VASECTOMY    . VASECTOMY     Social History: Social History    Socioeconomic History  . Marital status: Married    Spouse name: Not on file  . Number of children: Not on file  . Years of education: Not on file  . Highest education level: Not on file  Occupational History  . Occupation: Buyer, retail: [employer]   Tobacco Use  . Smoking status: Current Every Day Smoker    Packs/day: 1.00    Years: 22.00    Pack years: 22.00    Types: Cigarettes  . Smokeless tobacco: Never Used  Vaping Use  . Vaping Use: Never used  Substance and Sexual Activity  . Alcohol use: No    Comment: 2-3 drinks/month, mostly in summertime  . Drug use: No  . Sexual activity: Yes    Birth control/protection: Surgical    Comment: vasectomy  Other Topics Concern  . Not on file  Social History Narrative  . Not on file   Social Determinants of Health   Financial Resource Strain:   . Difficulty of Paying Living Expenses: Not on file  Food Insecurity:   . Worried About Programme researcher, broadcasting/film/video in the Last Year: Not on file  . Ran Out of Food in the Last Year: Not on file  Transportation Needs:   . Lack of Transportation (Medical): Not on file  . Lack of Transportation (Non-Medical): Not on file  Physical Activity:   . Days of Exercise per Week: Not on file  . Minutes of Exercise per Session: Not  on file  Stress:   . Feeling of Stress : Not on file  Social Connections:   . Frequency of Communication with Friends and Family: Not on file  . Frequency of Social Gatherings with Friends and Family: Not on file  . Attends Religious Services: Not on file  . Active Member of Clubs or Organizations: Not on file  . Attends Banker Meetings: Not on file  . Marital Status: Not on file   Family History: Family History  Problem Relation Age of Onset  . Heart disease Mother   . Drug abuse Mother   . Heart disease Father   . Heart attack Father   . Hypertension Father    Allergies: No Known Allergies Medications: See med  rec.  Review of Systems: See HPI for pertinent positives and negatives.   Objective:    General: Well Developed, well nourished, and in no acute distress.  Neuro: Alert and oriented x3.  HEENT: Normocephalic, atraumatic.  Skin: Warm and dry. Cardiac: Regular rate and rhythm, no murmurs rubs or gallops, no lower extremity edema.  Respiratory: Clear to auscultation bilaterally. Not using accessory muscles, speaking in full sentences.  Impression and Recommendations:    1. Restless leg syndrome Checking ferritin. Stop Ropinirole. Start gabapentin 200mg  nightly as needed. May titrate to a maximum of 600mg  nightly as needed. Advised patient to let me know what dose he finds that works for him and I will be glad to send in a new prescription.  - Fe+TIBC+Fer  2. Preventative health care Checking CBC, CMP, and Lipid panel today.  - CBC - COMPLETE METABOLIC PANEL WITH GFR - Lipid panel  Return in about 6 months (around 08/31/2020) for RLS follow up. __________________________________________ , DNP, APRN, FNP-BC [facility]

## 2020-03-03 NOTE — Patient Instructions (Signed)
Take Gabapentin 200mg  (2 capsules) at bedtime for RLS. May titrate up to a max of 600 mg nightly as needed for RLS. Once we find a good dose of this, we can send in a different prescription for a good dose.

## 2020-03-09 ENCOUNTER — Encounter: Payer: Self-pay | Admitting: Medical-Surgical

## 2020-03-09 DIAGNOSIS — G2581 Restless legs syndrome: Secondary | ICD-10-CM

## 2020-03-09 MED ORDER — ROPINIROLE HCL 3 MG PO TABS: 3.0000 mg | ORAL_TABLET | Freq: Every day | ORAL | 1 refills | Status: DC

## 2020-05-07 ENCOUNTER — Other Ambulatory Visit: Payer: Self-pay

## 2020-05-07 ENCOUNTER — Emergency Department (INDEPENDENT_AMBULATORY_CARE_PROVIDER_SITE_OTHER)
Admission: RE | Admit: 2020-05-07 | Discharge: 2020-05-07 | Disposition: A | Discharge status: Home / Self Care | Payer: BC Managed Care – PPO | Source: Ambulatory Visit | Attending: Internal Medicine | Admitting: Internal Medicine

## 2020-05-07 VITALS — BP 128/85 | HR 85 | Temp 98.0°F | Resp 18

## 2020-05-07 DIAGNOSIS — L03012 Cellulitis of left finger: Secondary | ICD-10-CM | POA: Diagnosis not present

## 2020-05-07 MED ORDER — DOXYCYCLINE HYCLATE 100 MG PO CAPS: 100.0000 mg | ORAL_CAPSULE | Freq: Two times a day (BID) | ORAL | 0 refills | Status: AC

## 2020-05-07 MED ORDER — IBUPROFEN 600 MG PO TABS: 600.0000 mg | ORAL_TABLET | Freq: Four times a day (QID) | ORAL | 0 refills | Status: AC | PRN

## 2020-05-07 NOTE — ED Provider Notes (Signed)
[facility]    CSN: [id number] Arrival date & time: 05/07/20  3810      History   Chief Complaint Chief Complaint  Patient presents with  . Hand Pain    LT index finger, appt 10am    HPI Darryl Vaughn is a 45 y.o. male comes to the urgent care with left index finger pain and swelling which started about 5 days ago.  Patient said pain started insidiously and is gotten progressively worse.  Swelling has also worsened over the past several days.  Last night the pain and swelling became unbearable.  Pain was throbbing.  No fever or chills.  No known relieving factors.  Patient came to the urgent care to be evaluated further. HPI  Past Medical History:  Diagnosis Date  . Chronic cough 11/22/2016  . DOE (dyspnea on exertion) 08/24/2016  . Dyslipidemia (high LDL; low HDL) 08/24/2016   16-PZ ASCVD risk 9.8%  . Pneumonia of left upper lobe due to infectious organism 12/13/2016  . Pneumothorax 1998  . Psoriasis   . Restless leg syndrome 09/19/2016  . Shingles   . Tobacco use disorder 08/22/2016    Patient Active Problem List   Diagnosis Date Noted  . Lateral epicondylitis of right elbow 06/11/2018  . Pneumonia of left upper lobe due to infectious organism 12/13/2016  . Chronic cough 11/22/2016  . Rhonchi 11/22/2016  . Non-seasonal allergic rhinitis 11/22/2016  . Restless leg syndrome 09/19/2016  . DOE (dyspnea on exertion) 08/24/2016  . Dyslipidemia (high LDL; low HDL) 08/24/2016  . Tobacco use disorder 08/22/2016  . Family history of heart attack 08/22/2016  . Psoriasis 08/22/2016    Past Surgical History:  Procedure Laterality Date  . CHEST TUBE INSERTION    . MANDIBLE SURGERY    . VASECTOMY    . VASECTOMY         Home Medications    Prior to Admission medications   Medication Sig Start Date End Date Taking? Authorizing Provider  doxycycline (VIBRAMYCIN) 100 MG capsule Take 1 capsule (100 mg total) by mouth 2 (two) times daily for 5 days. 05/07/20 05/12/20  Yes Amonte Brookover, Britta Mccreedy, MD  ibuprofen (ADVIL) 600 MG tablet Take 1 tablet (600 mg total) by mouth every 6 (six) hours as needed. 05/07/20  Yes Taylan Marez, Britta Mccreedy, MD  naproxen (NAPROSYN) 500 MG tablet Take 1 tablet (500 mg total) by mouth 2 (two) times daily with a meal. Patient taking differently: Take 500 mg by mouth 2 (two) times daily as needed.  02/03/19   Monica Becton, MD  rOPINIRole (REQUIP) 3 MG tablet Take 1 tablet (3 mg total) by mouth at bedtime. 03/09/20   Christen Butter, NP    Family History Family History  Problem Relation Age of Onset  . Heart disease Mother   . Drug abuse Mother   . Heart disease Father   . Heart attack Father   . Hypertension Father     Social History Social History   Tobacco Use  . Smoking status: Current Every Day Smoker    Packs/day: 1.00    Years: 22.00    Pack years: 22.00    Types: Cigarettes  . Smokeless tobacco: Never Used  Vaping Use  . Vaping Use: Never used  Substance Use Topics  . Alcohol use: No    Comment: 2-3 drinks/month, mostly in summertime  . Drug use: No     Allergies   Patient has no known allergies.   Review of Systems  Review of Systems  Constitutional: Negative.   Respiratory: Negative.   Gastrointestinal: Negative.      Physical Exam Triage Vital Signs ED Triage Vitals  Enc Vitals Group     BP 05/07/20 1007 128/85     Pulse Rate 05/07/20 1007 85     Resp 05/07/20 1007 18     Temp 05/07/20 1007 98 F (36.7 C)     Temp Source 05/07/20 1007 Oral     SpO2 05/07/20 1007 97 %     Weight --      Height --      Head Circumference --      Peak Flow --      Pain Score 05/07/20 1008 8     Pain Loc --      Pain Edu? --      Excl. in GC? --    No data found.  Updated Vital Signs BP 128/85 (BP Location: Left Arm)   Pulse 85   Temp 98 F (36.7 C) (Oral)   Resp 18   SpO2 97%   Visual Acuity Right Eye Distance:   Left Eye Distance:   Bilateral Distance:    Right Eye Near:   Left Eye Near:     Bilateral Near:     Physical Exam Vitals and nursing note reviewed.  Constitutional:      General: He is in acute distress.     Appearance: Normal appearance. He is not ill-appearing.  Cardiovascular:     Rate and Rhythm: Normal rate and regular rhythm.  Pulmonary:     Effort: Pulmonary effort is normal.     Breath sounds: Normal breath sounds.  Musculoskeletal:     Comments: Pain and swelling on the medial aspect of the left index finger nail.  Fluctuance noted.  Neurological:     Mental Status: He is alert.      UC Treatments / Results  Labs (all labs ordered are listed, but only abnormal results are displayed) Labs Reviewed - No data to display  EKG   Radiology No results found.  Procedures Incision and Drainage  Date/Time: 05/07/2020 11:51 AM Performed by: Merrilee Jansky, MD Authorized by: Merrilee Jansky, MD   Consent:    Consent obtained:  Verbal   Consent given by:  Patient   Risks discussed:  Bleeding, incomplete drainage and infection   Alternatives discussed:  No treatment and delayed treatment Universal protocol:    Procedure explained and questions answered to patient or proxy's satisfaction: yes     Relevant documents present and verified: yes     Patient identity confirmed:  Verbally with patient Location:    Type:  Abscess   Location:  Upper extremity   Upper extremity location:  Finger   Finger location:  L index finger Pre-procedure details:    Skin preparation:  Povidone-iodine Sedation:    Sedation type:  None Anesthesia:    Anesthesia method:  Local infiltration   Local anesthetic:  Lidocaine 1% w/o epi Procedure type:    Complexity:  Simple Procedure details:    Incision types:  Stab incision   Incision depth:  Subcutaneous   Wound management:  Extensive cleaning   Drainage:  Purulent and bloody   Drainage amount:  Moderate   Wound treatment:  Wound left open   Packing materials:  None Post-procedure details:     Procedure completion:  Tolerated well, no immediate complications   (including critical care time)  Medications Ordered in UC Medications -  No data to display  Initial Impression / Assessment and Plan / UC Course  I have reviewed the triage vital signs and the nursing notes.  Pertinent labs & imaging results that were available during my care of the patient were reviewed by me and considered in my medical decision making (see chart for details).     1.  Paronychia left index finger: Incision and drainage completed Discharge instructions given Doxycycline twice daily for 5 days given the extensive nature of the area involved in the amount of purulence Return precautions given Ibuprofen as needed for pain. Final Clinical Impressions(s) / UC Diagnoses   Final diagnoses:  Paronychia of left index finger     Discharge Instructions     Daily wound dressing changes with topical antibiotic for the next couple of days.  After that no adhesive dressing is needed. If he notices worsening pain, swelling, bleeding or discharge please return to the urgent care to be reevaluated. Please take medications as prescribed   ED Prescriptions    Medication Sig Dispense Auth. Provider   doxycycline (VIBRAMYCIN) 100 MG capsule Take 1 capsule (100 mg total) by mouth 2 (two) times daily for 5 days. 10 capsule Delanee Xin, Britta Mccreedy, MD   ibuprofen (ADVIL) 600 MG tablet Take 1 tablet (600 mg total) by mouth every 6 (six) hours as needed. 30 tablet [place] Con[place], Britta Mccreedy, MD     PDMP not reviewed this encounter.   Merrilee Jansky, MD 05/07/20 1153

## 2020-05-07 NOTE — Discharge Instructions (Signed)
Daily wound dressing changes with topical antibiotic for the next couple of days.  After that no adhesive dressing is needed. If he notices worsening pain, swelling, bleeding or discharge please return to the urgent care to be reevaluated. Please take medications as prescribed

## 2020-05-07 NOTE — ED Triage Notes (Signed)
Pt c/o pain of LT index finger. Says its been going on all week but last night started throbbing. Visible redness and swelling. Pain 8/10 Tylenol prn.

## 2020-08-24 ENCOUNTER — Other Ambulatory Visit: Payer: Self-pay | Admitting: Medical-Surgical

## 2020-08-24 DIAGNOSIS — G2581 Restless legs syndrome: Secondary | ICD-10-CM

## 2020-08-31 ENCOUNTER — Ambulatory Visit: Payer: Self-pay | Admitting: Medical-Surgical

## 2020-10-03 ENCOUNTER — Other Ambulatory Visit: Payer: Self-pay | Admitting: Medical-Surgical

## 2020-10-03 DIAGNOSIS — G2581 Restless legs syndrome: Secondary | ICD-10-CM

## 2020-10-19 ENCOUNTER — Other Ambulatory Visit: Payer: Self-pay | Admitting: Medical-Surgical

## 2020-10-19 DIAGNOSIS — G2581 Restless legs syndrome: Secondary | ICD-10-CM
